# Patient Record
Sex: Female | Born: 1985 | Race: Black or African American | Hispanic: No | Marital: Single | Smoking: Former smoker
Health system: Southern US, Community
[De-identification: ages and names within clinical notes are randomized; demographics above are authoritative.]

## PROBLEM LIST (undated history)

## (undated) ENCOUNTER — Inpatient Hospital Stay (HOSPITAL_COMMUNITY): Payer: Self-pay

## (undated) DIAGNOSIS — I10 Essential (primary) hypertension: Secondary | ICD-10-CM

## (undated) DIAGNOSIS — F419 Anxiety disorder, unspecified: Secondary | ICD-10-CM

## (undated) HISTORY — PX: OTHER SURGICAL HISTORY: SHX169

## (undated) HISTORY — DX: Anxiety disorder, unspecified: F41.9

## (undated) HISTORY — DX: Essential (primary) hypertension: I10

## (undated) HISTORY — PX: DILATION AND CURETTAGE OF UTERUS: SHX78

---

## 2005-08-12 ENCOUNTER — Emergency Department (HOSPITAL_COMMUNITY): Admission: EM | Admit: 2005-08-12 | Discharge: 2005-08-12 | Payer: Self-pay | Admitting: Family Medicine

## 2012-08-02 ENCOUNTER — Encounter (HOSPITAL_COMMUNITY): Payer: Self-pay | Admitting: *Deleted

## 2012-08-02 ENCOUNTER — Inpatient Hospital Stay (HOSPITAL_COMMUNITY)
Admission: AD | Admit: 2012-08-02 | Discharge: 2012-08-02 | Disposition: A | Discharge status: Home / Self Care | Payer: Self-pay | Source: Ambulatory Visit | Attending: Obstetrics & Gynecology | Admitting: Obstetrics & Gynecology

## 2012-08-02 DIAGNOSIS — R109 Unspecified abdominal pain: Secondary | ICD-10-CM | POA: Insufficient documentation

## 2012-08-02 DIAGNOSIS — K219 Gastro-esophageal reflux disease without esophagitis: Secondary | ICD-10-CM

## 2012-08-02 DIAGNOSIS — O99891 Other specified diseases and conditions complicating pregnancy: Secondary | ICD-10-CM | POA: Insufficient documentation

## 2012-08-02 LAB — URINALYSIS, ROUTINE W REFLEX MICROSCOPIC
Glucose, UA: NEGATIVE mg/dL
Ketones, ur: NEGATIVE mg/dL
Leukocytes, UA: NEGATIVE
Nitrite: NEGATIVE
Protein, ur: NEGATIVE mg/dL
Urobilinogen, UA: 0.2 mg/dL (ref 0.0–1.0)

## 2012-08-02 MED ORDER — GI COCKTAIL ~~LOC~~
30.0000 mL | Freq: Once | ORAL | Status: AC
  Administered 2012-08-02: 30 mL via ORAL
  Filled 2012-08-02: qty 30

## 2012-08-02 MED ORDER — RANITIDINE HCL 75 MG PO TABS: 75.0000 mg | ORAL_TABLET | Freq: Two times a day (BID) | ORAL | Status: DC

## 2012-08-02 NOTE — MAU Note (Signed)
Sharp abd pain, upper down to mid abdomen, felt like cramps, started @ 1100, eased up a little bit now.  Denies bleeding, had nausea - no vomitting.

## 2012-08-02 NOTE — MAU Provider Note (Signed)
History     CSN: [id number]  Arrival date and time: 08/02/12 1303   First Provider Initiated Contact with Patient 08/02/12 1501      Chief Complaint  Patient presents with  . Abdominal Pain   HPI Hailey Olson is a 26 y.o. G3P0020 at [redacted]w[redacted]d presenting with c/o upper quadrant abdominal pain that started around 10:00 am today. She was at work when the pain started. She ranked it as a 10/10, she came straight to the MAU. By the time she was put in a room the pain had improved to a 3/10. Denied N/V, constipation, excess gas, vaginal bleeding or discharge. No treatment has been attempted to relieve the pain. No previous history of abdominal pain with this pregnancy. No recent change in eating habits. She is concerned about miscarriage. She has had 2 miscarriages the most recent occuring in May. She has not established prenatal care yet and is asking for recommendations.    OB History    Grav Para Term Preterm Abortions TAB SAB Ect Mult Living   3 0 0 0 2 0 2 0 0 0       History reviewed. No pertinent past medical history.  Past Surgical History  Procedure Date  . Dilation and curettage of uterus     for 2nd trimester loss.    History reviewed. No pertinent family history.  History  Substance Use Topics  . Smoking status: Current Every Day Smoker -- 0.2 packs/day    Types: Cigarettes  . Smokeless tobacco: Never Used  . Alcohol Use: No    Allergies: No Known Allergies  Prescriptions prior to admission  Medication Sig Dispense Refill  . Prenatal Vit-Fe Fumarate-FA (PRENATAL MULTIVITAMIN) TABS Take 1 tablet by mouth daily.        Review of Systems  Constitutional: Negative for fever, chills and malaise/fatigue.  Gastrointestinal: Positive for abdominal pain. Negative for nausea, vomiting, diarrhea and constipation.  Genitourinary: Negative.    Physical Exam   Blood pressure 113/73, pulse 73, temperature 98 F (36.7 C), temperature source Oral, resp. rate 18, height 5\' 7"   (1.702 m), weight 93.532 kg (206 lb 3.2 oz), last menstrual period 05/04/2012.  Physical Exam  Constitutional: She is oriented to person, place, and time. She appears well-developed and well-nourished. No distress.  Cardiovascular: Normal rate, regular rhythm and normal heart sounds.   Respiratory: Effort normal and breath sounds normal.  GI: Soft. Bowel sounds are normal. She exhibits no mass. There is tenderness in the epigastric area. There is no rebound and no guarding.  Neurological: She is alert and oriented to person, place, and time.  Skin: Skin is warm and dry. She is not diaphoretic.  Psychiatric: She has a normal mood and affect. Her behavior is normal. Judgment and thought content normal.    MAU Course  Procedures  Results for orders placed during the hospital encounter of 08/02/12 (from the past 24 hour(s))  URINALYSIS, ROUTINE W REFLEX MICROSCOPIC     Status: Normal   Collection Time   08/02/12  1:35 PM      Component Value Range   Color, Urine YELLOW  YELLOW   APPearance CLEAR  CLEAR   Specific Gravity, Urine 1.025  1.005 - 1.030   pH 6.5  5.0 - 8.0   Glucose, UA NEGATIVE  NEGATIVE mg/dL   Hgb urine dipstick NEGATIVE  NEGATIVE   Bilirubin Urine NEGATIVE  NEGATIVE   Ketones, ur NEGATIVE  NEGATIVE mg/dL   Protein, ur NEGATIVE  NEGATIVE mg/dL  Urobilinogen, UA 0.2  0.0 - 1.0 mg/dL   Nitrite NEGATIVE  NEGATIVE   Leukocytes, UA NEGATIVE  NEGATIVE  POCT PREGNANCY, URINE     Status: Abnormal   Collection Time   08/02/12  2:53 PM      Component Value Range   Preg Test, Ur POSITIVE (*) NEGATIVE   Bedside U/S performed: IUP confirmed with +heartbeat   Assessment and Plan  A: 26 y.o. G3P0020 at [redacted]w[redacted]d      Abdominal pain, unchanged with GI cocktail     Possibly secondary to heartburn or gas  P: Prescription for zantac sent to pharmacy     Education about digestive health, encouraged to take probiotics and eat high-fiber diet     Information on local OBGYNs provided,  instructed pt to establish prenatal care  Corky Downs 08/02/2012, 3:27 PM

## 2012-08-02 NOTE — MAU Provider Note (Signed)
Patient seen and examined by me. Agree with PA-Student assessment and note.   Hailey Berkshire N. 4:41 PM 08/02/12

## 2012-08-09 ENCOUNTER — Other Ambulatory Visit: Payer: Self-pay

## 2013-06-07 ENCOUNTER — Encounter (HOSPITAL_COMMUNITY): Payer: Self-pay | Admitting: *Deleted

## 2014-06-26 ENCOUNTER — Encounter (HOSPITAL_COMMUNITY): Payer: Self-pay | Admitting: *Deleted

## 2019-04-11 DIAGNOSIS — Z30430 Encounter for insertion of intrauterine contraceptive device: Secondary | ICD-10-CM | POA: Diagnosis not present

## 2019-08-30 DIAGNOSIS — L219 Seborrheic dermatitis, unspecified: Secondary | ICD-10-CM | POA: Diagnosis not present

## 2019-12-21 DIAGNOSIS — E669 Obesity, unspecified: Secondary | ICD-10-CM | POA: Diagnosis not present

## 2019-12-21 DIAGNOSIS — R635 Abnormal weight gain: Secondary | ICD-10-CM | POA: Diagnosis not present

## 2019-12-21 DIAGNOSIS — E010 Iodine-deficiency related diffuse (endemic) goiter: Secondary | ICD-10-CM | POA: Diagnosis not present

## 2019-12-21 DIAGNOSIS — R0989 Other specified symptoms and signs involving the circulatory and respiratory systems: Secondary | ICD-10-CM | POA: Diagnosis not present

## 2019-12-21 DIAGNOSIS — Z6839 Body mass index (BMI) 39.0-39.9, adult: Secondary | ICD-10-CM | POA: Diagnosis not present

## 2019-12-21 DIAGNOSIS — R200 Anesthesia of skin: Secondary | ICD-10-CM | POA: Diagnosis not present

## 2020-02-16 DIAGNOSIS — E041 Nontoxic single thyroid nodule: Secondary | ICD-10-CM | POA: Diagnosis not present

## 2020-02-16 DIAGNOSIS — E010 Iodine-deficiency related diffuse (endemic) goiter: Secondary | ICD-10-CM | POA: Diagnosis not present

## 2020-12-14 ENCOUNTER — Encounter: Payer: Self-pay | Admitting: Family Medicine

## 2020-12-14 ENCOUNTER — Other Ambulatory Visit: Payer: Self-pay

## 2020-12-14 ENCOUNTER — Ambulatory Visit (INDEPENDENT_AMBULATORY_CARE_PROVIDER_SITE_OTHER): Payer: BC Managed Care – PPO | Admitting: Family Medicine

## 2020-12-14 VITALS — BP 120/90 | HR 75 | Temp 97.9°F | Ht 66.5 in | Wt 255.2 lb

## 2020-12-14 DIAGNOSIS — R198 Other specified symptoms and signs involving the digestive system and abdomen: Secondary | ICD-10-CM

## 2020-12-14 DIAGNOSIS — E041 Nontoxic single thyroid nodule: Secondary | ICD-10-CM

## 2020-12-14 DIAGNOSIS — R0989 Other specified symptoms and signs involving the circulatory and respiratory systems: Secondary | ICD-10-CM

## 2020-12-14 MED ORDER — OMEPRAZOLE 40 MG PO CPDR: 40.0000 mg | DELAYED_RELEASE_CAPSULE | Freq: Every day | ORAL | 2 refills | Status: DC

## 2020-12-14 NOTE — Progress Notes (Signed)
Hailey Olson is a 35 y.o. female  Chief Complaint  Patient presents with  . Establish Care    Np here to est care. Pt will sign a ROR. Pt c/o having trouble swallowing food, pills x 4 months. Pt would like for provider to look at boil/knot on chest.    HPI: Hailey Olson is a 35 y.o. female who endorses globus sensation that is intermittent x 4 mo. She feels is it present mainly a little while after she eats.  She denies trouble swallowing or choking episodes. No issues with breathing, speaking. No runny nose, PND, seasonal allergies. No cough. Rare heartburn with red sauce and sausage, otherwise no.   She had US thyroid done in 2020 - 1.8cm Rt thyroid nodule. 1 year f/u recommended.     History reviewed. No pertinent past medical history.  Past Surgical History:  Procedure Laterality Date  . DILATION AND CURETTAGE OF UTERUS     for 2nd trimester loss.  . parturition  2014 and 2015    Social History   Socioeconomic History  . Marital status: Single    Spouse name: Not on file  . Number of children: Not on file  . Years of education: Not on file  . Highest education level: Not on file  Occupational History  . Not on file  Tobacco Use  . Smoking status: Former Smoker    Packs/day: 0.25    Types: Cigarettes  . Smokeless tobacco: Never Used  Vaping Use  . Vaping Use: Every day  Substance and Sexual Activity  . Alcohol use: No  . Drug use: No  . Sexual activity: Yes    Comment: last intercourse yesterday  Other Topics Concern  . Not on file  Social History Narrative  . Not on file   Social Determinants of Health   Financial Resource Strain: Not on file  Food Insecurity: Not on file  Transportation Needs: Not on file  Physical Activity: Not on file  Stress: Not on file  Social Connections: Not on file  Intimate Partner Violence: Not on file    Family History  Problem Relation Age of Onset  . Hypertension Mother   . Diabetes Mother   . Hyperlipidemia  Father       There is no immunization history on file for this patient.  Outpatient Encounter Medications as of 12/14/2020  Medication Sig  . amLODipine (NORVASC) 5 MG tablet amlodipine 5 mg tablet  . Prenatal Vit-Fe Fumarate-FA (PRENATAL MULTIVITAMIN) TABS Take 1 tablet by mouth daily. (Patient not taking: Reported on 12/14/2020)  . propranolol (INDERAL) 20 MG tablet Take by mouth. (Patient not taking: Reported on 12/14/2020)  . [DISCONTINUED] ranitidine (ZANTAC 75) 75 MG tablet Take 1 tablet (75 mg total) by mouth 2 (two) times daily.   No facility-administered encounter medications on file as of 12/14/2020.     ROS: Pertinent positives and negatives noted in HPI. Remainder of ROS non-contributory    No Known Allergies  BP 120/90 (BP Location: Left Arm, Patient Position: Sitting, Cuff Size: Normal)   Pulse 75   Temp 97.9 F (36.6 C) (Oral)   Ht 5' 6.5" (1.689 m)   Wt 255 lb 3.2 oz (115.8 kg)   SpO2 98%   BMI 40.57 kg/m   Physical Exam Constitutional:      General: She is not in acute distress.    Appearance: Normal appearance. She is not ill-appearing.  Neck:     Thyroid: Thyromegaly (mild) present. No thyroid  tenderness.  Pulmonary:     Effort: No respiratory distress.  Abdominal:     General: Bowel sounds are normal. There is no distension.     Palpations: Abdomen is soft.     Tenderness: There is no abdominal tenderness.  Musculoskeletal:     Cervical back: Normal range of motion and neck supple. No tenderness.  Lymphadenopathy:     Cervical: No cervical adenopathy.  Neurological:     Mental Status: She is alert and oriented to person, place, and time.  Psychiatric:        Mood and Affect: Mood normal.        Behavior: Behavior normal.      A/P:  1. Globus sensation - DG UGI W DOUBLE CM (HD BA); Future Rx: - omeprazole (PRILOSEC) 40 MG capsule; Take 1 capsule (40 mg total) by mouth daily.  Dispense: 30 capsule; Refill: 2 - recommend 3-4 wk trial of  med  2. Thyroid nodule - overdue for [place] f/u - US THYROID; Future

## 2021-01-08 ENCOUNTER — Other Ambulatory Visit: Payer: BC Managed Care – PPO

## 2021-01-14 ENCOUNTER — Other Ambulatory Visit: Payer: Self-pay

## 2021-01-28 ENCOUNTER — Other Ambulatory Visit: Payer: BC Managed Care – PPO

## 2021-02-01 ENCOUNTER — Ambulatory Visit
Admission: RE | Admit: 2021-02-01 | Discharge: 2021-02-01 | Disposition: A | Discharge status: Home / Self Care | Payer: BC Managed Care – PPO | Source: Ambulatory Visit | Attending: Family Medicine | Admitting: Family Medicine

## 2021-02-01 DIAGNOSIS — K219 Gastro-esophageal reflux disease without esophagitis: Secondary | ICD-10-CM | POA: Diagnosis not present

## 2021-02-01 DIAGNOSIS — E041 Nontoxic single thyroid nodule: Secondary | ICD-10-CM | POA: Diagnosis not present

## 2021-02-01 DIAGNOSIS — R0989 Other specified symptoms and signs involving the circulatory and respiratory systems: Secondary | ICD-10-CM

## 2021-03-06 ENCOUNTER — Ambulatory Visit (INDEPENDENT_AMBULATORY_CARE_PROVIDER_SITE_OTHER): Payer: BC Managed Care – PPO | Admitting: Family Medicine

## 2021-03-06 ENCOUNTER — Other Ambulatory Visit: Payer: Self-pay

## 2021-03-06 ENCOUNTER — Encounter: Payer: Self-pay | Admitting: Family Medicine

## 2021-03-06 VITALS — BP 142/100 | HR 73 | Temp 97.7°F | Ht 66.25 in | Wt 247.4 lb

## 2021-03-06 DIAGNOSIS — Z0000 Encounter for general adult medical examination without abnormal findings: Secondary | ICD-10-CM

## 2021-03-06 DIAGNOSIS — I10 Essential (primary) hypertension: Secondary | ICD-10-CM

## 2021-03-06 DIAGNOSIS — Z13220 Encounter for screening for lipoid disorders: Secondary | ICD-10-CM

## 2021-03-06 DIAGNOSIS — Z23 Encounter for immunization: Secondary | ICD-10-CM

## 2021-03-06 DIAGNOSIS — H6123 Impacted cerumen, bilateral: Secondary | ICD-10-CM

## 2021-03-06 MED ORDER — AMLODIPINE BESYLATE 5 MG PO TABS: 5.0000 mg | ORAL_TABLET | Freq: Every day | ORAL | 1 refills | Status: DC

## 2021-03-06 NOTE — Patient Instructions (Signed)
[facility]  [email or web address] [address], [address] [place] [address] P: [phone] F: [phone] [email or web address]  [facility] [email or web address] [address] , [place] [address] fax: [phone] [email or web address]  

## 2021-03-06 NOTE — Progress Notes (Signed)
Hailey Olson is a 35 y.o. female  Chief Complaint  Patient presents with   Annual Exam    Pt not fasting today. Pt unsure of last pap and Tdap    HPI: Hailey Olson is a 35 y.o. female patient seen today for annual CPE, labs. She is not fasting today and will RTO for fasting lab appt.   Last PAP: 10/2016 - normal PAP, no h/o abnormal PAP  Diet/Exercise: pt lost 8lbs since last OV but states she hasn't changed her diet or activity level Dental: due Vision: due   Med refills needed today? Yes, per orders   History reviewed. No pertinent past medical history.  Past Surgical History:  Procedure Laterality Date   DILATION AND CURETTAGE OF UTERUS     for 2nd trimester loss.   parturition  2014 and 2015    Social History   Socioeconomic History   Marital status: Single    Spouse name: Not on file   Number of children: Not on file   Years of education: Not on file   Highest education level: Not on file  Occupational History   Not on file  Tobacco Use   Smoking status: Former    Packs/day: 0.25    Pack years: 0.00    Types: Cigarettes   Smokeless tobacco: Never  Vaping Use   Vaping Use: Every day  Substance and Sexual Activity   Alcohol use: No   Drug use: No   Sexual activity: Yes    Comment: last intercourse yesterday  Other Topics Concern   Not on file  Social History Narrative   Not on file   Social Determinants of Health   Financial Resource Strain: Not on file  Food Insecurity: Not on file  Transportation Needs: Not on file  Physical Activity: Not on file  Stress: Not on file  Social Connections: Not on file  Intimate Partner Violence: Not on file    Family History  Problem Relation Age of Onset   Hypertension Mother    Diabetes Mother    Hyperlipidemia Father      There is no immunization history for the selected administration types on file for this patient.  Outpatient Encounter Medications as of 03/06/2021  Medication Sig   amLODipine  (NORVASC) 5 MG tablet amlodipine 5 mg tablet   [DISCONTINUED] omeprazole (PRILOSEC) 40 MG capsule Take 1 capsule (40 mg total) by mouth daily. (Patient not taking: Reported on 03/06/2021)   [DISCONTINUED] Prenatal Vit-Fe Fumarate-FA (PRENATAL MULTIVITAMIN) TABS Take 1 tablet by mouth daily. (Patient not taking: Reported on 12/14/2020)   [DISCONTINUED] propranolol (INDERAL) 20 MG tablet Take by mouth. (Patient not taking: Reported on 12/14/2020)   No facility-administered encounter medications on file as of 03/06/2021.     ROS: Gen: no fever, chills  Skin: no rash, itching ENT: no ear pain, ear drainage, nasal congestion, rhinorrhea, sinus pressure, sore throat Eyes: no blurry vision, double vision Resp: no cough, wheeze,SOB Breast: no breast tenderness, no nipple discharge, no breast masses CV: no CP, palpitations, LE edema,  GI: no heartburn, n/v/d/c, abd pain GU: no dysuria, urgency, frequency, hematuria Neuro: no dizziness, headache, weakness, vertigo Psych: no depression, anxiety, insomnia   No Known Allergies  BP (!) 142/100 (BP Location: Right Arm, Patient Position: Sitting, Cuff Size: Normal)   Pulse 73   Temp 97.7 F (36.5 C) (Temporal)   Ht 5' 6.25" (1.683 m)   Wt 247 lb 6.4 oz (112.2 kg)   SpO2 99%  BMI 39.63 kg/m  Wt Readings from Last 3 Encounters:  03/06/21 247 lb 6.4 oz (112.2 kg)  12/14/20 255 lb 3.2 oz (115.8 kg)  08/02/12 206 lb 3.2 oz (93.5 kg)   Temp Readings from Last 3 Encounters:  03/06/21 97.7 F (36.5 C) (Temporal)  12/14/20 97.9 F (36.6 C) (Oral)  08/02/12 98 F (36.7 C) (Oral)   BP Readings from Last 3 Encounters:  03/06/21 (!) 142/100  12/14/20 120/90  08/02/12 111/65   Pulse Readings from Last 3 Encounters:  03/06/21 73  12/14/20 75  08/02/12 61     Physical Exam Constitutional:      General: She is not in acute distress.    Appearance: She is well-developed. She is obese.  HENT:     Head: Normocephalic and atraumatic.      Right Ear: Ear canal normal. There is impacted cerumen.     Left Ear: Ear canal normal. There is impacted cerumen.     Nose: Nose normal.     Mouth/Throat:     Mouth: Mucous membranes are moist.     Pharynx: Oropharynx is clear.  Eyes:     Conjunctiva/sclera: Conjunctivae normal.  Neck:     Thyroid: No thyromegaly.  Cardiovascular:     Rate and Rhythm: Normal rate and regular rhythm.     Heart sounds: Normal heart sounds. No murmur heard. Pulmonary:     Effort: Pulmonary effort is normal. No respiratory distress.     Breath sounds: Normal breath sounds. No wheezing or rhonchi.  Abdominal:     General: Bowel sounds are normal. There is no distension.     Palpations: Abdomen is soft. There is no mass.     Tenderness: There is no abdominal tenderness.  Musculoskeletal:     Cervical back: Neck supple.     Right lower leg: No edema.     Left lower leg: No edema.  Lymphadenopathy:     Cervical: No cervical adenopathy.  Skin:    General: Skin is warm and dry.  Neurological:     Mental Status: She is alert and oriented to person, place, and time.     Motor: No abnormal muscle tone.     Coordination: Coordination normal.  Psychiatric:        Behavior: Behavior normal.     A/P:  1. Annual physical exam - discussed importance of regular CV exercise, healthy diet, adequate sleep - due for dental and vision - due for PAP - will schedule with GYN - CBC; Future - Basic metabolic panel; Future - AST; Future - ALT; Future - next CPE in 1 year  2. Benign essential HTN - elevated today but is caring for her 4yo niece and is trying to pack for vacation this Friday - Basic metabolic panel; Future - amLODipine (NORVASC) 5 MG tablet; Take 1 tablet (5 mg total) by mouth daily.  Dispense: 90 tablet; Refill: 1  3. Screening for lipid disorders - Lipid panel; Future  4. Need for Tdap vaccination - Tdap vaccine greater than or equal to 7yo IM  5. Bilateral impacted cerumen - informed  consent obtained, ears flushed, pt tolerated without issue - Ear Lavage   This visit occurred during the SARS-CoV-2 public health emergency.  Safety protocols were in place, including screening questions prior to the visit, additional usage of staff PPE, and extensive cleaning of exam room while observing appropriate contact time as indicated for disinfecting solutions.

## 2021-03-13 ENCOUNTER — Other Ambulatory Visit: Payer: BC Managed Care – PPO

## 2021-03-13 ENCOUNTER — Other Ambulatory Visit: Payer: Self-pay | Admitting: Family Medicine

## 2021-03-13 DIAGNOSIS — R198 Other specified symptoms and signs involving the digestive system and abdomen: Secondary | ICD-10-CM

## 2021-03-13 DIAGNOSIS — R0989 Other specified symptoms and signs involving the circulatory and respiratory systems: Secondary | ICD-10-CM

## 2021-07-23 ENCOUNTER — Ambulatory Visit: Payer: BC Managed Care – PPO | Admitting: Nurse Practitioner

## 2021-10-10 ENCOUNTER — Other Ambulatory Visit: Payer: Self-pay

## 2021-10-11 ENCOUNTER — Ambulatory Visit (INDEPENDENT_AMBULATORY_CARE_PROVIDER_SITE_OTHER): Payer: BC Managed Care – PPO | Admitting: Nurse Practitioner

## 2021-10-11 ENCOUNTER — Encounter: Payer: Self-pay | Admitting: Nurse Practitioner

## 2021-10-11 VITALS — BP 134/86 | HR 83 | Temp 96.9°F | Wt 247.4 lb

## 2021-10-11 DIAGNOSIS — I10 Essential (primary) hypertension: Secondary | ICD-10-CM | POA: Diagnosis not present

## 2021-10-11 DIAGNOSIS — R519 Headache, unspecified: Secondary | ICD-10-CM | POA: Diagnosis not present

## 2021-10-11 DIAGNOSIS — Z13220 Encounter for screening for lipoid disorders: Secondary | ICD-10-CM | POA: Diagnosis not present

## 2021-10-11 DIAGNOSIS — Z136 Encounter for screening for cardiovascular disorders: Secondary | ICD-10-CM

## 2021-10-11 DIAGNOSIS — F419 Anxiety disorder, unspecified: Secondary | ICD-10-CM

## 2021-10-11 NOTE — Assessment & Plan Note (Signed)
Chronic, stable. BP 134/86 today. Continue amlodipine 5mg  daily. Check CMP, CBC today. Follow up in 5 months for physical.

## 2021-10-11 NOTE — Patient Instructions (Signed)
It was great to see you!  We are going to check some labs today and will send the results to mychart.   Let's follow-up in 5 months, sooner if you have concerns.  If a referral was placed today, you will be contacted for an appointment. Please note that routine referrals can sometimes take up to 3-4 weeks to process. Please call our office if you haven't heard anything after this time frame.  Take care,  Rodman Pickle, NP

## 2021-10-11 NOTE — Assessment & Plan Note (Signed)
Chronic, stable. Continue propranolol as needed for anxiety. Follow up in 5 months for CPE.

## 2021-10-11 NOTE — Assessment & Plan Note (Signed)
She endorses headache and numb feeling for about a month. Symptoms are now resolved. Encouraged her to check her blood pressure at home if this happens again. Will check vitamin B12 and TSH with numbness feelings. Follow up if these symptoms occur again.

## 2021-10-11 NOTE — Progress Notes (Signed)
Established Patient Office Visit  Subjective:  Patient ID: Hailey Olson, female    DOB: 04-15-1986  Age: 36 y.o. MRN: [id number]  CC:  Chief Complaint  Patient presents with   Transitions Of Care    Est care. Pt c/o numbness feeling with headaches/sharp pains x1 month    HPI [name] presents to transfer care to a new provider. Introduced to Designer, jewellery role and practice setting. All questions answered. Discussed provider/patient relationship and expectations.  She has noticed numb feelings in random spots on her head, not numb to touch but just a feeling. Also had some spots with a sharp pain. This started about a month ago. She is unsure if this is part of her anxiety. These symptoms were occurring every day for the last month. BC goody poweder helped headaches but not "numb" feeling. She denies vision changes, confusion, forgetfulness, weakness, slurred speech, and double vision. She endorses sometimes she would feel off-balance. She has had headaches with elevated blood pressure in past. Not checking BP at home. These headaches and numbness feelings have resolved on Monday.   Depression and anxiety screening done and shows:  Depression screen [facility] 2/9 10/11/2021  Decreased Interest 0  Down, Depressed, Hopeless 0  PHQ - 2 Score 0  Altered sleeping 0  Tired, decreased energy 0  Change in appetite 0  Feeling bad or failure about yourself  0  Trouble concentrating 0  Moving slowly or fidgety/restless 0  Suicidal thoughts 0  PHQ-9 Score 0  Difficult doing work/chores Not difficult at all   GAD 7 : Generalized Anxiety Score 10/11/2021  Nervous, Anxious, on Edge 1  Control/stop worrying 0  Worry too much - different things 2  Trouble relaxing 0  Restless 0  Easily annoyed or irritable 1  Afraid - awful might happen 2  Total GAD 7 Score 6  Anxiety Difficulty Not difficult at all    Past Medical History:  Diagnosis Date   Anxiety    Hypertension     Past Surgical  History:  Procedure Laterality Date   DILATION AND CURETTAGE OF UTERUS     for 2nd trimester loss.   parturition  2014 and 2015    Family History  Problem Relation Age of Onset   Hypertension Mother    Diabetes Mother    Hyperlipidemia Father     Social History   Socioeconomic History   Marital status: Single    Spouse name: Not on file   Number of children: Not on file   Years of education: Not on file   Highest education level: Not on file  Occupational History   Not on file  Tobacco Use   Smoking status: Former    Packs/day: 0.25    Types: Cigarettes   Smokeless tobacco: Never  Vaping Use   Vaping Use: Every day   Substances: Nicotine  Substance and Sexual Activity   Alcohol use: No   Drug use: No   Sexual activity: Yes    Comment: last intercourse yesterday  Other Topics Concern   Not on file  Social History Narrative   Not on file   Social Determinants of Health   Financial Resource Strain: Not on file  Food Insecurity: Not on file  Transportation Needs: Not on file  Physical Activity: Not on file  Stress: Not on file  Social Connections: Not on file  Intimate Partner Violence: Not on file    Outpatient Medications Prior to Visit  Medication Sig Dispense  Refill   propranolol (INDERAL) 20 MG tablet Take 20 mg by mouth 3 (three) times daily as needed.     amLODipine (NORVASC) 5 MG tablet Take 1 tablet (5 mg total) by mouth daily. 90 tablet 1   No facility-administered medications prior to visit.    No Known Allergies  ROS Review of Systems  Constitutional: Negative.   HENT:  Positive for ear pain (intermittent). Negative for congestion and sore throat.   Eyes: Negative.   Respiratory: Negative.    Cardiovascular: Negative.   Gastrointestinal: Negative.   Genitourinary: Negative.   Musculoskeletal: Negative.   Skin: Negative.   Neurological:  Positive for headaches (see hpi).  Psychiatric/Behavioral:  The patient is nervous/anxious.       Objective:    Physical Exam Vitals and nursing note reviewed.  Constitutional:      General: She is not in acute distress.    Appearance: Normal appearance.  HENT:     Head: Normocephalic and atraumatic.     Right Ear: Tympanic membrane, ear canal and external ear normal.     Left Ear: Tympanic membrane, ear canal and external ear normal.     Nose: Nose normal.     Mouth/Throat:     Mouth: Mucous membranes are moist.     Pharynx: Oropharynx is clear.  Eyes:     Extraocular Movements: Extraocular movements intact.     Conjunctiva/sclera: Conjunctivae normal.     Pupils: Pupils are equal, round, and reactive to light.  Cardiovascular:     Rate and Rhythm: Normal rate and regular rhythm.     Pulses: Normal pulses.     Heart sounds: Normal heart sounds.  Pulmonary:     Effort: Pulmonary effort is normal.     Breath sounds: Normal breath sounds.  Abdominal:     General: Bowel sounds are normal.     Palpations: Abdomen is soft.     Tenderness: There is no abdominal tenderness.  Musculoskeletal:        General: Normal range of motion.     Cervical back: Normal range of motion and neck supple. No tenderness.  Lymphadenopathy:     Cervical: No cervical adenopathy.  Skin:    General: Skin is warm and dry.  Neurological:     General: No focal deficit present.     Mental Status: She is alert and oriented to person, place, and time.     Cranial Nerves: No cranial nerve deficit.     Motor: No weakness.     Coordination: Coordination normal.     Gait: Gait normal.     Deep Tendon Reflexes: Reflexes normal.  Psychiatric:        Mood and Affect: Mood normal.        Behavior: Behavior normal.        Thought Content: Thought content normal.        Judgment: Judgment normal.    BP 134/86 (BP Location: Right Arm, Cuff Size: Large)    Pulse 83    Temp (!) 96.9 F (36.1 C) (Temporal)    Wt 247 lb 6.4 oz (112.2 kg)    SpO2 99%    BMI 39.63 kg/m  Wt Readings from Last 3 Encounters:   10/11/21 247 lb 6.4 oz (112.2 kg)  03/06/21 247 lb 6.4 oz (112.2 kg)  12/14/20 255 lb 3.2 oz (115.8 kg)     Health Maintenance Due  Topic Date Due   Hepatitis C Screening  Never done  PAP SMEAR-Modifier  10/28/2019    There are no preventive care reminders to display for this patient.  No results found for: TSH No results found for: WBC, HGB, HCT, MCV, PLT No results found for: NA, K, CHLORIDE, CO2, GLUCOSE, BUN, CREATININE, BILITOT, ALKPHOS, AST, ALT, PROT, ALBUMIN, CALCIUM, ANIONGAP, EGFR, GFR No results found for: CHOL No results found for: HDL No results found for: LDLCALC No results found for: TRIG No results found for: CHOLHDL No results found for: HGBA1C    Assessment & Plan:   Problem List Items Addressed This Visit       Cardiovascular and Mediastinum   Primary hypertension    Chronic, stable. BP 134/86 today. Continue amlodipine 65m daily. Check CMP, CBC today. Follow up in 5 months for physical.       Relevant Medications   propranolol (INDERAL) 20 MG tablet   Other Relevant Orders   CBC with Differential/Platelet   Comprehensive metabolic panel     Other   Nonintractable episodic headache - Primary    She endorses headache and numb feeling for about a month. Symptoms are now resolved. Encouraged her to check her blood pressure at home if this happens again. Will check vitamin B12 and TSH with numbness feelings. Follow up if these symptoms occur again.       Relevant Medications   propranolol (INDERAL) 20 MG tablet   Other Relevant Orders   Vitamin B12   Anxiety    Chronic, stable. Continue propranolol as needed for anxiety. Follow up in 5 months for CPE.       Relevant Orders   TSH   Other Visit Diagnoses     Encounter for lipid screening for cardiovascular disease       Check lipid panel today and treat based on results.   Relevant Orders   Lipid panel       No orders of the defined types were placed in this encounter.   Follow-up:  Return in about 5 months (around 03/10/2022) for CPE after 03/07/22.    LCharyl Dancer NP

## 2021-10-12 LAB — COMPREHENSIVE METABOLIC PANEL
AG Ratio: 1.3 (calc) (ref 1.0–2.5)
ALT: 9 U/L (ref 6–29)
AST: 8 U/L — ABNORMAL LOW (ref 10–30)
Albumin: 4 g/dL (ref 3.6–5.1)
Alkaline phosphatase (APISO): 48 U/L (ref 31–125)
BUN: 12 mg/dL (ref 7–25)
CO2: 24 mmol/L (ref 20–32)
Calcium: 9.7 mg/dL (ref 8.6–10.2)
Chloride: 107 mmol/L (ref 98–110)
Creat: 0.94 mg/dL (ref 0.50–0.97)
Globulin: 3 g/dL (calc) (ref 1.9–3.7)
Glucose, Bld: 86 mg/dL (ref 65–99)
Potassium: 3.9 mmol/L (ref 3.5–5.3)
Sodium: 140 mmol/L (ref 135–146)
Total Bilirubin: 0.2 mg/dL (ref 0.2–1.2)
Total Protein: 7 g/dL (ref 6.1–8.1)

## 2021-10-12 LAB — CBC WITH DIFFERENTIAL/PLATELET
Absolute Monocytes: 510 cells/uL (ref 200–950)
Basophils Absolute: 18 cells/uL (ref 0–200)
Basophils Relative: 0.3 %
Eosinophils Absolute: 78 cells/uL (ref 15–500)
Eosinophils Relative: 1.3 %
HCT: 35 % (ref 35.0–45.0)
Hemoglobin: 11.7 g/dL (ref 11.7–15.5)
Lymphs Abs: 1944 cells/uL (ref 850–3900)
MCH: 28.2 pg (ref 27.0–33.0)
MCHC: 33.4 g/dL (ref 32.0–36.0)
MCV: 84.3 fL (ref 80.0–100.0)
MPV: 10.2 fL (ref 7.5–12.5)
Monocytes Relative: 8.5 %
Neutro Abs: 3450 cells/uL (ref 1500–7800)
Neutrophils Relative %: 57.5 %
Platelets: 341 10*3/uL (ref 140–400)
RBC: 4.15 10*6/uL (ref 3.80–5.10)
RDW: 13.6 % (ref 11.0–15.0)
Total Lymphocyte: 32.4 %
WBC: 6 10*3/uL (ref 3.8–10.8)

## 2021-10-12 LAB — VITAMIN B12: Vitamin B-12: 500 pg/mL (ref 200–1100)

## 2021-10-12 LAB — LIPID PANEL
Cholesterol: 217 mg/dL — ABNORMAL HIGH (ref <200)
HDL: 40 mg/dL — ABNORMAL LOW (ref >=50)
LDL Cholesterol (Calc): 146 mg/dL (calc) — ABNORMAL HIGH
Non-HDL Cholesterol (Calc): 177 mg/dL (calc) — ABNORMAL HIGH (ref <130)
Total CHOL/HDL Ratio: 5.4 (calc) — ABNORMAL HIGH (ref <5.0)
Triglycerides: 176 mg/dL — ABNORMAL HIGH (ref <150)

## 2021-10-12 LAB — TSH: TSH: 0.98 mIU/L

## 2021-11-28 ENCOUNTER — Other Ambulatory Visit: Payer: Self-pay

## 2021-11-28 DIAGNOSIS — I10 Essential (primary) hypertension: Secondary | ICD-10-CM

## 2021-11-28 MED ORDER — AMLODIPINE BESYLATE 5 MG PO TABS: 5.0000 mg | ORAL_TABLET | Freq: Every day | ORAL | 1 refills | Status: DC

## 2022-03-10 ENCOUNTER — Other Ambulatory Visit: Payer: Self-pay | Admitting: Nurse Practitioner

## 2022-03-10 ENCOUNTER — Ambulatory Visit (INDEPENDENT_AMBULATORY_CARE_PROVIDER_SITE_OTHER): Payer: BC Managed Care – PPO | Admitting: Nurse Practitioner

## 2022-03-10 ENCOUNTER — Other Ambulatory Visit (HOSPITAL_COMMUNITY)
Admission: RE | Admit: 2022-03-10 | Discharge: 2022-03-10 | Disposition: A | Discharge status: Home / Self Care | Payer: BC Managed Care – PPO | Source: Ambulatory Visit | Attending: Nurse Practitioner | Admitting: Nurse Practitioner

## 2022-03-10 ENCOUNTER — Encounter: Payer: Self-pay | Admitting: Nurse Practitioner

## 2022-03-10 ENCOUNTER — Ambulatory Visit (INDEPENDENT_AMBULATORY_CARE_PROVIDER_SITE_OTHER): Payer: BC Managed Care – PPO

## 2022-03-10 VITALS — BP 124/90 | HR 80 | Temp 97.5°F | Wt 247.2 lb

## 2022-03-10 DIAGNOSIS — Z8249 Family history of ischemic heart disease and other diseases of the circulatory system: Secondary | ICD-10-CM

## 2022-03-10 DIAGNOSIS — R0789 Other chest pain: Secondary | ICD-10-CM

## 2022-03-10 DIAGNOSIS — Z0001 Encounter for general adult medical examination with abnormal findings: Secondary | ICD-10-CM

## 2022-03-10 DIAGNOSIS — F419 Anxiety disorder, unspecified: Secondary | ICD-10-CM | POA: Diagnosis not present

## 2022-03-10 DIAGNOSIS — M79671 Pain in right foot: Secondary | ICD-10-CM

## 2022-03-10 DIAGNOSIS — Z124 Encounter for screening for malignant neoplasm of cervix: Secondary | ICD-10-CM | POA: Diagnosis not present

## 2022-03-10 DIAGNOSIS — R6889 Other general symptoms and signs: Secondary | ICD-10-CM

## 2022-03-10 DIAGNOSIS — E782 Mixed hyperlipidemia: Secondary | ICD-10-CM | POA: Diagnosis not present

## 2022-03-10 DIAGNOSIS — I10 Essential (primary) hypertension: Secondary | ICD-10-CM

## 2022-03-10 DIAGNOSIS — E041 Nontoxic single thyroid nodule: Secondary | ICD-10-CM

## 2022-03-10 MED ORDER — PROPRANOLOL HCL 20 MG PO TABS: 20.0000 mg | ORAL_TABLET | Freq: Three times a day (TID) | ORAL | 1 refills | Status: DC | PRN

## 2022-03-10 NOTE — Progress Notes (Signed)
EKG interpreted by me on 03/10/22 showed sinus bradycardia with a heart rate of 59. No ST or T wave changes.

## 2022-03-10 NOTE — Patient Instructions (Signed)
It was great to see you!  We are checking an x-ray of your foot and labs  I have placed a referral to cardiology, they will call to schedule.  I am ordering a ultrasound of your thyroid, they will call to schedule.   Let's follow-up in 6 months, sooner if you have concerns.  If a referral was placed today, you will be contacted for an appointment. Please note that routine referrals can sometimes take up to 3-4 weeks to process. Please call our office if you haven't heard anything after this time frame.  Take care,  Rodman Pickle, NP

## 2022-03-10 NOTE — Progress Notes (Unsigned)
Initial visit for RT heel pain. Denies injury

## 2022-03-10 NOTE — Progress Notes (Unsigned)
BP 124/90 (BP Location: Right Arm, Cuff Size: Large)   Pulse 80   Temp (!) 97.5 F (36.4 C) (Temporal)   Wt 247 lb 3.2 oz (112.1 kg)   SpO2 99%   BMI 39.60 kg/m    Subjective:    Patient ID: Hailey Olson, female    DOB: 10/20/1985, 36 y.o.   MRN: [id number]  CC: Chief Complaint  Patient presents with   Follow-up    CPE w/pap. Pt is fasting   HPI: Hailey Olson is a 36 y.o. female presenting on 03/10/2022 for comprehensive medical examination. Current medical complaints include: anxiety  She has been having increased anxiety recently. She had 2 family members pass away recently from heart attacks. Her cousin was 1841 and had a massive heart attack and passed away. She has been taking propanolol 20mg  as needed for anxiety. She noticed a "strain" sensation on the left side of her chest that would come and go for a week. She is not sure if this is due to anxiety.   She has been having right heel pain after standing on her feet for long periods of time. She describes it as someone is stabbing her with a pin. The pain comes and goes. She has had this pain for a while but recently it has been occurring more frequently. She will sit down for a while which helps with the pain.   She currently lives with: children Menopausal Symptoms: no  Depression Screen done today and results listed below:     03/10/2022    1:03 PM 10/11/2021    2:49 PM  Depression screen PHQ 2/9  Decreased Interest 0 0  Down, Depressed, Hopeless 0 0  PHQ - 2 Score 0 0  Altered sleeping 0 0  Tired, decreased energy 0 0  Change in appetite 0 0  Feeling bad or failure about yourself  0 0  Trouble concentrating 0 0  Moving slowly or fidgety/restless 0 0  Suicidal thoughts 0 0  PHQ-9 Score 0 0  Difficult doing work/chores  Not difficult at all      03/10/2022    1:03 PM 10/11/2021    2:49 PM  GAD 7 : Generalized Anxiety Score  Nervous, Anxious, on Edge 2 1  Control/stop worrying 1 0  Worry too much - different  things 0 2  Trouble relaxing 0 0  Restless 0 0  Easily annoyed or irritable 0 1  Afraid - awful might happen 2 2  Total GAD 7 Score 5 6  Anxiety Difficulty  Not difficult at all    The patient does not have a history of falls. I did not complete a risk assessment for falls. A plan of care for falls was not documented.   Past Medical History:  Past Medical History:  Diagnosis Date   Anxiety    Hypertension     Surgical History:  Past Surgical History:  Procedure Laterality Date   DILATION AND CURETTAGE OF UTERUS     for 2nd trimester loss.   parturition  2014 and 2015    Medications:  Current Outpatient Medications on File Prior to Visit  Medication Sig   amLODipine (NORVASC) 5 MG tablet Take 1 tablet (5 mg total) by mouth daily.   No current facility-administered medications on file prior to visit.    Allergies:  No Known Allergies  Social History:  Social History   Socioeconomic History   Marital status: Single    Spouse name: Not on file  Number of children: Not on file   Years of education: Not on file   Highest education level: Not on file  Occupational History   Not on file  Tobacco Use   Smoking status: Former    Packs/day: 0.25    Types: Cigarettes   Smokeless tobacco: Never  Vaping Use   Vaping Use: Every day   Substances: Nicotine  Substance and Sexual Activity   Alcohol use: No   Drug use: No   Sexual activity: Yes    Comment: last intercourse yesterday  Other Topics Concern   Not on file  Social History Narrative   Not on file   Social Determinants of Health   Financial Resource Strain: Not on file  Food Insecurity: Not on file  Transportation Needs: Not on file  Physical Activity: Not on file  Stress: Not on file  Social Connections: Not on file  Intimate Partner Violence: Not on file   Social History   Tobacco Use  Smoking Status Former   Packs/day: 0.25   Types: Cigarettes  Smokeless Tobacco Never   Social History    Substance and Sexual Activity  Alcohol Use No    Family History:  Family History  Problem Relation Age of Onset   Hypertension Mother    Diabetes Mother    Hyperlipidemia Father    Heart attack Paternal Grandmother    Stroke Paternal Grandmother    Heart attack Other 62    Past medical history, surgical history, medications, allergies, family history and social history reviewed with patient today and changes made to appropriate areas of the chart.   Review of Systems  Constitutional: Negative.   HENT: Negative.    Eyes: Negative.   Respiratory: Negative.    Cardiovascular:        Left side "strain" intermittent  Gastrointestinal:  Positive for constipation. Negative for abdominal pain.  Genitourinary: Negative.   Musculoskeletal:  Positive for joint pain.       Right heel   Skin: Negative.   Neurological:  Positive for headaches (intermittent).  Psychiatric/Behavioral:  The patient is nervous/anxious.       Objective:    BP 124/90 (BP Location: Right Arm, Cuff Size: Large)   Pulse 80   Temp (!) 97.5 F (36.4 C) (Temporal)   Wt 247 lb 3.2 oz (112.1 kg)   SpO2 99%   BMI 39.60 kg/m   Wt Readings from Last 3 Encounters:  03/10/22 247 lb 3.2 oz (112.1 kg)  10/11/21 247 lb 6.4 oz (112.2 kg)  03/06/21 247 lb 6.4 oz (112.2 kg)    Physical Exam Vitals and nursing note reviewed.  Constitutional:      General: She is not in acute distress.    Appearance: Normal appearance.  HENT:     Head: Normocephalic and atraumatic.     Right Ear: Tympanic membrane, ear canal and external ear normal.     Left Ear: Tympanic membrane, ear canal and external ear normal.  Eyes:     Conjunctiva/sclera: Conjunctivae normal.  Cardiovascular:     Rate and Rhythm: Normal rate and regular rhythm.     Pulses: Normal pulses.     Heart sounds: Normal heart sounds.  Pulmonary:     Effort: Pulmonary effort is normal.     Breath sounds: Normal breath sounds.  Abdominal:     Palpations:  Abdomen is soft.     Tenderness: There is no abdominal tenderness.  Musculoskeletal:        General:  No tenderness. Normal range of motion.     Cervical back: Normal range of motion and neck supple. No tenderness.     Right lower leg: No edema.     Left lower leg: No edema.  Lymphadenopathy:     Cervical: No cervical adenopathy.  Skin:    General: Skin is warm and dry.  Neurological:     General: No focal deficit present.     Mental Status: She is alert and oriented to person, place, and time.     Cranial Nerves: No cranial nerve deficit.     Coordination: Coordination normal.     Gait: Gait normal.  Psychiatric:        Mood and Affect: Mood normal.        Behavior: Behavior normal.        Thought Content: Thought content normal.        Judgment: Judgment normal.     Results for orders placed or performed in visit on 10/11/21  Lipid panel  Result Value Ref Range   Cholesterol 217 (H) <200 mg/dL   HDL 40 (L) > OR = 50 mg/dL   Triglycerides 720 (H) <150 mg/dL   LDL Cholesterol (Calc) 146 (H) mg/dL (calc)   Total CHOL/HDL Ratio 5.4 (H) <5.0 (calc)   Non-HDL Cholesterol (Calc) 177 (H) <130 mg/dL (calc)  CBC with Differential/Platelet  Result Value Ref Range   WBC 6.0 3.8 - 10.8 Thousand/uL   RBC 4.15 3.80 - 5.10 Million/uL   Hemoglobin 11.7 11.7 - 15.5 g/dL   HCT 94.7 09.6 - 28.3 %   MCV 84.3 80.0 - 100.0 fL   MCH 28.2 27.0 - 33.0 pg   MCHC 33.4 32.0 - 36.0 g/dL   RDW 66.2 94.7 - 65.4 %   Platelets 341 140 - 400 Thousand/uL   MPV 10.2 7.5 - 12.5 fL   Neutro Abs 3,450 1,500 - 7,800 cells/uL   Lymphs Abs 1,944 850 - 3,900 cells/uL   Absolute Monocytes 510 200 - 950 cells/uL   Eosinophils Absolute 78 15 - 500 cells/uL   Basophils Absolute 18 0 - 200 cells/uL   Neutrophils Relative % 57.5 %   Total Lymphocyte 32.4 %   Monocytes Relative 8.5 %   Eosinophils Relative 1.3 %   Basophils Relative 0.3 %  Comprehensive metabolic panel  Result Value Ref Range   Glucose, Bld  86 65 - 99 mg/dL   BUN 12 7 - 25 mg/dL   Creat 6.50 3.54 - 6.56 mg/dL   BUN/Creatinine Ratio NOT APPLICABLE 6 - 22 (calc)   Sodium 140 135 - 146 mmol/L   Potassium 3.9 3.5 - 5.3 mmol/L   Chloride 107 98 - 110 mmol/L   CO2 24 20 - 32 mmol/L   Calcium 9.7 8.6 - 10.2 mg/dL   Total Protein 7.0 6.1 - 8.1 g/dL   Albumin 4.0 3.6 - 5.1 g/dL   Globulin 3.0 1.9 - 3.7 g/dL (calc)   AG Ratio 1.3 1.0 - 2.5 (calc)   Total Bilirubin 0.2 0.2 - 1.2 mg/dL   Alkaline phosphatase (APISO) 48 31 - 125 U/L   AST 8 (L) 10 - 30 U/L   ALT 9 6 - 29 U/L  TSH  Result Value Ref Range   TSH 0.98 mIU/L  Vitamin B12  Result Value Ref Range   Vitamin B-12 500 200 - 1,100 pg/mL      Assessment & Plan:   Problem List Items Addressed This Visit  Cardiovascular and Mediastinum   Primary hypertension    Chronic, stable.  She did not take her medicine this morning.  BP today 124/90.  Continue amlodipine 5 mg daily.  Follow-up in 6 months        Endocrine   Thyroid nodule    Thyroid nodule noted on imaging 2 years ago.  Recommend yearly imaging for 5 years to make sure that it is stable.  We will check TSH and thyroid ultrasound.      Relevant Orders   TSH   US THYROID     Other   Anxiety    Chronic, ongoing.  She recently had 2 deaths in her family which exacerbated her symptoms.  Her PHQ-9 is as 0 and her GAD-7 is a 5.  She is taking propanolol 20 mg as needed for anxiety which helps her symptoms.  She does not feel like she needs any other medication at this time.  Follow-up if symptoms worsen or with any concerns.      Left chest pressure    She has been having some left intermittent chest strain.  She is unsure if this is related to anxiety.  EKG done today which showed sinus bradycardia with a heart rate of 59, no ST or T wave changes.  We will place referral to cardiology with her family history of heart disease and her cousin recently passing at age 35 due to a massive heart attack.       Relevant Orders   EKG 12-Lead (Completed)   Ambulatory referral to Cardiology   Mixed hyperlipidemia    She has a history of elevated cholesterol, will recheck fasting lipid panel today      Relevant Orders   Lipid panel   Sensitivity to the cold    She has been feeling sensitivity to the cold.  She states that when the [facility] is on it 75 she stays cold in her house.  We will check an iron panel, CBC, and TSH today.      Relevant Orders   CBC   Iron, TIBC and Ferritin Panel   Pain of right heel    He has been having pain in her right heel for the past several years that is gotten worse recently.  The pain feels like somebody is stabbing her and happens when she stands on her feet too long.  We will check an x-ray to rule out heel spur.  She can take tylenol or ibuprofen as needed for pain. Follow-up after imaging.      Relevant Orders   DG Foot Complete Right   Family history of heart disease    She has a significant family of heart disease and her cousin passed away recently at age 24 due to a massive heart attack.  We will place referral to cardiology per patient request.      Relevant Orders   Ambulatory referral to Cardiology   Other Visit Diagnoses     Encounter for general adult medical examination with abnormal findings    -  Primary   Health maintenance reviewed and updated.  TD booster updated and Pap done today.  Discussed nutrition, exercise.  Follow-up 1 year   Screening for cervical cancer       Pap done today   Relevant Orders   Cytology - PAP        Follow up plan: Return in about 6 months (around 09/10/2022) for HTN, HLD.   LABORATORY TESTING:  - Pap  smear: done elsewhere  IMMUNIZATIONS:   - Tdap: Tetanus vaccination status reviewed: Td vaccination indicated and given today. - Influenza: Given elsewhere - Pneumovax: Given elsewhere - Prevnar: Given elsewhere - HPV: Given elsewhere - Zostavax vaccine: Given elsewhere  SCREENING: -Mammogram: Done  elsewhere  - Colonoscopy: Done elsewhere  - Bone Density: Done elsewhere  -Hearing Test: Done elsewhere  -Spirometry: Done elsewhere   PATIENT COUNSELING:   Advised to take 1 mg of folate supplement per day if capable of pregnancy.   Sexuality: Discussed sexually transmitted diseases, partner selection, use of condoms, avoidance of unintended pregnancy  and contraceptive alternatives.   Advised to avoid cigarette smoking.  I discussed with the patient that most people either abstain from alcohol or drink within safe limits (<=14/week and <=4 drinks/occasion for males, <=7/weeks and <= 3 drinks/occasion for females) and that the risk for alcohol disorders and other health effects rises proportionally with the number of drinks per week and how often a drinker exceeds daily limits.  Discussed cessation/primary prevention of drug use and availability of treatment for abuse.   Diet: Encouraged to adjust caloric intake to maintain  or achieve ideal body weight, to reduce intake of dietary saturated fat and total fat, to limit sodium intake by avoiding high sodium foods and not adding table salt, and to maintain adequate dietary potassium and calcium preferably from fresh fruits, vegetables, and low-fat dairy products.    stressed the importance of regular exercise  Injury prevention: Discussed safety belts, safety helmets, smoke detector, smoking near bedding or upholstery.   Dental health: Discussed importance of regular tooth brushing, flossing, and dental visits.    NEXT PREVENTATIVE PHYSICAL DUE IN 1 YEAR. Return in about 6 months (around 09/10/2022) for HTN, HLD.

## 2022-03-11 ENCOUNTER — Encounter: Payer: Self-pay | Admitting: Nurse Practitioner

## 2022-03-11 ENCOUNTER — Encounter: Payer: BC Managed Care – PPO | Admitting: Nurse Practitioner

## 2022-03-11 DIAGNOSIS — R6889 Other general symptoms and signs: Secondary | ICD-10-CM | POA: Insufficient documentation

## 2022-03-11 DIAGNOSIS — M79671 Pain in right foot: Secondary | ICD-10-CM

## 2022-03-11 DIAGNOSIS — E041 Nontoxic single thyroid nodule: Secondary | ICD-10-CM | POA: Insufficient documentation

## 2022-03-11 DIAGNOSIS — E782 Mixed hyperlipidemia: Secondary | ICD-10-CM | POA: Insufficient documentation

## 2022-03-11 DIAGNOSIS — R0789 Other chest pain: Secondary | ICD-10-CM | POA: Insufficient documentation

## 2022-03-11 DIAGNOSIS — Z8249 Family history of ischemic heart disease and other diseases of the circulatory system: Secondary | ICD-10-CM | POA: Insufficient documentation

## 2022-03-11 NOTE — Assessment & Plan Note (Signed)
Thyroid nodule noted on imaging 2 years ago.  Recommend yearly imaging for 5 years to make sure that it is stable.  We will check TSH and thyroid ultrasound.

## 2022-03-11 NOTE — Assessment & Plan Note (Signed)
She has been having some left intermittent chest strain.  She is unsure if this is related to anxiety.  EKG done today which showed sinus bradycardia with a heart rate of 59, no ST or T wave changes.  We will place referral to cardiology with her family history of heart disease and her cousin recently passing at age 38 due to a massive heart attack.

## 2022-03-11 NOTE — Assessment & Plan Note (Signed)
She has a significant family of heart disease and her cousin passed away recently at age 38 due to a massive heart attack.  We will place referral to cardiology per patient request.

## 2022-03-11 NOTE — Assessment & Plan Note (Addendum)
Chronic, ongoing.  She recently had 2 deaths in her family which exacerbated her symptoms.  Her PHQ-9 is as 0 and her GAD-7 is a 5.  She is taking propanolol 20 mg as needed for anxiety which helps her symptoms.  She does not feel like she needs any other medication at this time.  Follow-up if symptoms worsen or with any concerns.

## 2022-03-11 NOTE — Assessment & Plan Note (Signed)
She has been feeling sensitivity to the cold.  She states that when the [facility] is on it 75 she stays cold in her house.  We will check an iron panel, CBC, and TSH today.

## 2022-03-11 NOTE — Assessment & Plan Note (Addendum)
He has been having pain in her right heel for the past several years that is gotten worse recently.  The pain feels like somebody is stabbing her and happens when she stands on her feet too long.  We will check an x-ray to rule out heel spur.  She can take tylenol or ibuprofen as needed for pain. Follow-up after imaging.

## 2022-03-11 NOTE — Assessment & Plan Note (Signed)
Chronic, stable.  She did not take her medicine this morning.  BP today 124/90.  Continue amlodipine 5 mg daily.  Follow-up in 6 months

## 2022-03-11 NOTE — Assessment & Plan Note (Signed)
She has a history of elevated cholesterol, will recheck fasting lipid panel today

## 2022-03-13 ENCOUNTER — Other Ambulatory Visit: Payer: BC Managed Care – PPO

## 2022-03-14 ENCOUNTER — Encounter: Payer: Self-pay | Admitting: Nurse Practitioner

## 2022-03-14 LAB — CYTOLOGY - PAP
Adequacy: ABSENT
Comment: NEGATIVE
Diagnosis: NEGATIVE
High risk HPV: NEGATIVE

## 2022-03-14 MED ORDER — METRONIDAZOLE 500 MG PO TABS: 500.0000 mg | ORAL_TABLET | Freq: Two times a day (BID) | ORAL | 0 refills | Status: AC

## 2022-03-24 ENCOUNTER — Ambulatory Visit (INDEPENDENT_AMBULATORY_CARE_PROVIDER_SITE_OTHER): Payer: BC Managed Care – PPO

## 2022-03-24 ENCOUNTER — Ambulatory Visit (INDEPENDENT_AMBULATORY_CARE_PROVIDER_SITE_OTHER): Payer: BC Managed Care – PPO | Admitting: Podiatry

## 2022-03-24 DIAGNOSIS — M722 Plantar fascial fibromatosis: Secondary | ICD-10-CM

## 2022-03-24 DIAGNOSIS — M79671 Pain in right foot: Secondary | ICD-10-CM

## 2022-03-24 DIAGNOSIS — M7731 Calcaneal spur, right foot: Secondary | ICD-10-CM

## 2022-03-24 MED ORDER — MELOXICAM 15 MG PO TABS: 15.0000 mg | ORAL_TABLET | Freq: Every day | ORAL | 0 refills | Status: DC | PRN

## 2022-03-24 MED ORDER — METHYLPREDNISOLONE 4 MG PO TBPK: ORAL_TABLET | ORAL | 0 refills | Status: DC

## 2022-03-24 NOTE — Patient Instructions (Signed)
If was nice to meet you today. If you have any questions or any further concerns, please feel fee to give me a call. You can call our office at [phone] or please feel fee to send me a message through MyChart.   Start with the steroid (medrol dose pack) and once complete you can start the meloxicam to use as needed.   For inserts I like Powersteps, superfeet, and aetrex.   For instructions on how to put on your Plantar Fascial Brace, please visit [email or web address]  Plantar Fasciitis (Heel Spur Syndrome) with Rehab The plantar fascia is a fibrous, ligament-like, soft-tissue structure that spans the bottom of the foot. Plantar fasciitis is a condition that causes pain in the foot due to inflammation of the tissue. SYMPTOMS  Pain and tenderness on the underneath side of the foot. Pain that worsens with standing or walking. CAUSES  Plantar fasciitis is caused by irritation and injury to the plantar fascia on the underneath side of the foot. Common mechanisms of injury include: Direct trauma to bottom of the foot. Damage to a small nerve that runs under the foot where the main fascia attaches to the heel bone. Stress placed on the plantar fascia due to bone spurs. RISK INCREASES WITH:  Activities that place stress on the plantar fascia (running, jumping, pivoting, or cutting). Poor strength and flexibility. Improperly fitted shoes. Tight calf muscles. Flat feet. Failure to warm-up properly before activity. Obesity. PREVENTION Warm up and stretch properly before activity. Allow for adequate recovery between workouts. Maintain physical fitness: Strength, flexibility, and endurance. Cardiovascular fitness. Maintain a health body weight. Avoid stress on the plantar fascia. Wear properly fitted shoes, including arch supports for individuals who have flat feet.  PROGNOSIS  If treated properly, then the symptoms of plantar fasciitis usually resolve without surgery. However,  occasionally surgery is necessary.  RELATED COMPLICATIONS  Recurrent symptoms that may result in a chronic condition. Problems of the lower back that are caused by compensating for the injury, such as limping. Pain or weakness of the foot during push-off following surgery. Chronic inflammation, scarring, and partial or complete fascia tear, occurring more often from repeated injections.  TREATMENT  Treatment initially involves the use of ice and medication to help reduce pain and inflammation. The use of strengthening and stretching exercises may help reduce pain with activity, especially stretches of the Achilles tendon. These exercises may be performed at home or with a therapist. Your caregiver may recommend that you use heel cups of arch supports to help reduce stress on the plantar fascia. Occasionally, corticosteroid injections are given to reduce inflammation. If symptoms persist for greater than 6 months despite non-surgical (conservative), then surgery may be recommended.   MEDICATION  If pain medication is necessary, then nonsteroidal anti-inflammatory medications, such as aspirin and ibuprofen, or other minor pain relievers, such as acetaminophen, are often recommended. Do not take pain medication within 7 days before surgery. Prescription pain relievers may be given if deemed necessary by your caregiver. Use only as directed and only as much as you need. Corticosteroid injections may be given by your caregiver. These injections should be reserved for the most serious cases, because they may only be given a certain number of times.  HEAT AND COLD Cold treatment (icing) relieves pain and reduces inflammation. Cold treatment should be applied for 10 to 15 minutes every 2 to 3 hours for inflammation and pain and immediately after any activity that aggravates your symptoms. Use ice packs or massage the area  with a piece of ice (ice massage). Heat treatment may be used prior to performing  the stretching and strengthening activities prescribed by your caregiver, physical therapist, or athletic trainer. Use a heat pack or soak the injury in warm water.  SEEK IMMEDIATE MEDICAL CARE IF: Treatment seems to offer no benefit, or the condition worsens. Any medications produce adverse side effects.  EXERCISES- RANGE OF MOTION (ROM) AND STRETCHING EXERCISES - Plantar Fasciitis (Heel Spur Syndrome) These exercises may help you when beginning to rehabilitate your injury. Your symptoms may resolve with or without further involvement from your physician, physical therapist or athletic trainer. While completing these exercises, remember:  Restoring tissue flexibility helps normal motion to return to the joints. This allows healthier, less painful movement and activity. An effective stretch should be held for at least 30 seconds. A stretch should never be painful. You should only feel a gentle lengthening or release in the stretched tissue.  RANGE OF MOTION - Toe Extension, Flexion Sit with your right / left leg crossed over your opposite knee. Grasp your toes and gently pull them back toward the top of your foot. You should feel a stretch on the bottom of your toes and/or foot. Hold this stretch for 10 seconds. Now, gently pull your toes toward the bottom of your foot. You should feel a stretch on the top of your toes and or foot. Hold this stretch for 10 seconds. Repeat  times. Complete this stretch 3 times per day.   RANGE OF MOTION - Ankle Dorsiflexion, Active Assisted Remove shoes and sit on a chair that is preferably not on a carpeted surface. Place right / left foot under knee. Extend your opposite leg for support. Keeping your heel down, slide your right / left foot back toward the chair until you feel a stretch at your ankle or calf. If you do not feel a stretch, slide your bottom forward to the edge of the chair, while still keeping your heel down. Hold this stretch for 10  seconds. Repeat 3 times. Complete this stretch 2 times per day.   STRETCH  Gastroc, Standing Place hands on wall. Extend right / left leg, keeping the front knee somewhat bent. Slightly point your toes inward on your back foot. Keeping your right / left heel on the floor and your knee straight, shift your weight toward the wall, not allowing your back to arch. You should feel a gentle stretch in the right / left calf. Hold this position for 10 seconds. Repeat 3 times. Complete this stretch 2 times per day.  STRETCH  Soleus, Standing Place hands on wall. Extend right / left leg, keeping the other knee somewhat bent. Slightly point your toes inward on your back foot. Keep your right / left heel on the floor, bend your back knee, and slightly shift your weight over the back leg so that you feel a gentle stretch deep in your back calf. Hold this position for 10 seconds. Repeat 3 times. Complete this stretch 2 times per day.  STRETCH  Gastrocsoleus, Standing  Note: This exercise can place a lot of stress on your foot and ankle. Please complete this exercise only if specifically instructed by your caregiver.  Place the ball of your right / left foot on a step, keeping your other foot firmly on the same step. Hold on to the wall or a rail for balance. Slowly lift your other foot, allowing your body weight to press your heel down over the edge of  the step. You should feel a stretch in your right / left calf. Hold this position for 10 seconds. Repeat this exercise with a slight bend in your right / left knee. Repeat 3 times. Complete this stretch 2 times per day.   STRENGTHENING EXERCISES - Plantar Fasciitis (Heel Spur Syndrome)  These exercises may help you when beginning to rehabilitate your injury. They may resolve your symptoms with or without further involvement from your physician, physical therapist or athletic trainer. While completing these exercises, remember:  Muscles can gain both  the endurance and the strength needed for everyday activities through controlled exercises. Complete these exercises as instructed by your physician, physical therapist or athletic trainer. Progress the resistance and repetitions only as guided.  STRENGTH - Towel Curls Sit in a chair positioned on a non-carpeted surface. Place your foot on a towel, keeping your heel on the floor. Pull the towel toward your heel by only curling your toes. Keep your heel on the floor. Repeat 3 times. Complete this exercise 2 times per day.  STRENGTH - Ankle Inversion Secure one end of a rubber exercise band/tubing to a fixed object (table, pole). Loop the other end around your foot just before your toes. Place your fists between your knees. This will focus your strengthening at your ankle. Slowly, pull your big toe up and in, making sure the band/tubing is positioned to resist the entire motion. Hold this position for 10 seconds. Have your muscles resist the band/tubing as it slowly pulls your foot back to the starting position. Repeat 3 times. Complete this exercises 2 times per day.  Document Released: 08/11/2005 Document Revised: 11/03/2011 Document Reviewed: 11/23/2008 [facility] Patient Information 2014 [place], [place].

## 2022-03-24 NOTE — Progress Notes (Unsigned)
Subjective:   Patient ID: Hailey Olson, female   DOB: 36 y.o.   MRN: [id number]   HPI Chief Complaint  Patient presents with   Foot Pain    Right heel pain which started 1 year ago, feels like stepping on a nail, medial side of the heel, Rate of pain is a9 out of 10 when standing for work, X-Rays were taken  March 10 2022    No recent treatment. She discussed her her PCP and got a x-ray and referred for following. No swelling. No numbness. She works on her feet all the time and when she is on the feet a lot it hurts or trhobs when she sits. It feels OK in the morning but sometimes it is sore depending on how much she is on her feet. She did get Dr. Regino Schultze inserts, made it hurt worse. No injury.    ROS      Objective:  Physical Exam  ***     Assessment:  ***     Plan:  ***    Pf brace

## 2022-03-26 ENCOUNTER — Other Ambulatory Visit: Payer: BC Managed Care – PPO

## 2022-04-02 ENCOUNTER — Ambulatory Visit: Payer: BC Managed Care – PPO | Admitting: Cardiovascular Disease

## 2022-04-16 ENCOUNTER — Other Ambulatory Visit: Payer: Self-pay | Admitting: Podiatry

## 2022-04-16 DIAGNOSIS — M722 Plantar fascial fibromatosis: Secondary | ICD-10-CM

## 2022-04-17 ENCOUNTER — Other Ambulatory Visit: Payer: Self-pay | Admitting: Nurse Practitioner

## 2022-04-17 DIAGNOSIS — I10 Essential (primary) hypertension: Secondary | ICD-10-CM

## 2022-05-02 ENCOUNTER — Ambulatory Visit: Payer: BC Managed Care – PPO | Attending: Cardiovascular Disease | Admitting: Cardiovascular Disease

## 2022-05-02 ENCOUNTER — Encounter: Payer: Self-pay | Admitting: Cardiovascular Disease

## 2022-05-02 VITALS — BP 132/88 | HR 80 | Ht 67.0 in | Wt 250.8 lb

## 2022-05-02 DIAGNOSIS — E785 Hyperlipidemia, unspecified: Secondary | ICD-10-CM | POA: Diagnosis not present

## 2022-05-02 DIAGNOSIS — I10 Essential (primary) hypertension: Secondary | ICD-10-CM

## 2022-05-02 DIAGNOSIS — R0789 Other chest pain: Secondary | ICD-10-CM

## 2022-05-02 NOTE — Progress Notes (Signed)
Cardiology Office Note:    Date:  05/02/2022   ID:  Hailey Olson, DOB [birth year], MRN [id number]  PCP:  Gerre Scull, NP   [facility] Providers Cardiologist:  None { Click to update primary MD,subspecialty MD or APP then REFRESH:1}    Referring MD: Gerre Scull, NP   No chief complaint on file. ***  History of Present Illness:    Hailey Olson is a 36 y.o. female with a hx of ***  Past Medical History:  Diagnosis Date   Anxiety    Hypertension     Past Surgical History:  Procedure Laterality Date   DILATION AND CURETTAGE OF UTERUS     for 2nd trimester loss.   parturition  2014 and 2015    Current Medications: Current Meds  Medication Sig   amLODipine (NORVASC) 5 MG tablet TAKE 1 TABLET BY MOUTH ONCE DAILY   propranolol (INDERAL) 20 MG tablet TAKE 1 TABLET BY MOUTH 3 TIMES DAILY AS NEEDED.     Allergies:   Patient has no known allergies.   Social History   Socioeconomic History   Marital status: Single    Spouse name: Not on file   Number of children: Not on file   Years of education: Not on file   Highest education level: Not on file  Occupational History   Not on file  Tobacco Use   Smoking status: Former    Packs/day: 0.25    Types: Cigarettes   Smokeless tobacco: Never  Vaping Use   Vaping Use: Every day   Substances: Nicotine  Substance and Sexual Activity   Alcohol use: No   Drug use: No   Sexual activity: Yes    Comment: last intercourse yesterday  Other Topics Concern   Not on file  Social History Narrative   Not on file   Social Determinants of Health   Financial Resource Strain: Not on file  Food Insecurity: Not on file  Transportation Needs: Not on file  Physical Activity: Not on file  Stress: Not on file  Social Connections: Not on file     Family History: The patient's ***family history includes Diabetes in her mother; Heart attack in her paternal grandmother; Heart attack (age of onset: 9) in an other  family member; Hyperlipidemia in her father; Hypertension in her mother; Stroke in her paternal grandmother.  ROS:   Please see the history of present illness.    *** All other systems reviewed and are negative.  EKGs/Labs/Other Studies Reviewed:    The following studies were reviewed today: ***  EKG:  EKG is *** ordered today.  The ekg ordered today demonstrates ***  Recent Labs: 10/11/2021: ALT 9; BUN 12; Creat 0.94; Hemoglobin 11.7; Platelets 341; Potassium 3.9; Sodium 140; TSH 0.98  Recent Lipid Panel    Component Value Date/Time   CHOL 217 (H) 10/11/2021 1456   TRIG 176 (H) 10/11/2021 1456   HDL 40 (L) 10/11/2021 1456   CHOLHDL 5.4 (H) 10/11/2021 1456   LDLCALC 146 (H) 10/11/2021 1456     Risk Assessment/Calculations:   {Does this patient have ATRIAL FIBRILLATION?:[id number]}            Physical Exam:    VS:  BP 132/88 (BP Location: Left Arm, Patient Position: Sitting, Cuff Size: Large)   Pulse 80   Ht 5\' 7"  (1.702 m)   Wt 250 lb 12.8 oz (113.8 kg)   SpO2 95%   BMI 39.28 kg/m     Wt  Readings from Last 3 Encounters:  05/02/22 250 lb 12.8 oz (113.8 kg)  03/10/22 247 lb 3.2 oz (112.1 kg)  10/11/21 247 lb 6.4 oz (112.2 kg)     GEN: *** Well nourished, well developed in no acute distress HEENT: Normal NECK: No JVD; No carotid bruits LYMPHATICS: No lymphadenopathy CARDIAC: ***RRR, no murmurs, rubs, gallops RESPIRATORY:  Clear to auscultation without rales, wheezing or rhonchi  ABDOMEN: Soft, non-tender, non-distended MUSCULOSKELETAL:  No edema; No deformity  SKIN: Warm and dry NEUROLOGIC:  Alert and oriented x 3 PSYCHIATRIC:  Normal affect   ASSESSMENT:    No diagnosis found. PLAN:    In order of problems listed above:  ***      {Are you ordering a CV Procedure (e.g. stress test, cath, DCCV, TEE, etc)?   Press F2        :[id number]}    Medication Adjustments/Labs and Tests Ordered: Current medicines are reviewed at length with the patient  today.  Concerns regarding medicines are outlined above.  No orders of the defined types were placed in this encounter.  No orders of the defined types were placed in this encounter.   There are no Patient Instructions on file for this visit.   Signed, Thurmon Fair, MD  05/02/2022 4:19 PM    [facility]

## 2022-05-02 NOTE — Patient Instructions (Signed)
Medication Instructions:  No changes *If you need a refill on your cardiac medications before your next appointment, please call your pharmacy*   Lab Work: Your provider would like for you to return in 6 months before your next appointment to have the following labs drawn: fasting NMR. You do not need an appointment for the lab. Once in our office lobby there is a podium where you can sign in and ring the doorbell to alert [place] that you are here. The lab is open from 8:00 am to 4 pm; closed for lunch from 12:45pm-1:45pm.  You may also go to any of these [facility] locations:   [facility] - [address] ([facility]) - [address] [phone] [address]     If you have labs (blood work) drawn today and your tests are completely normal, you will receive your results only by: [employer] (if you have MyChart) OR A paper copy in the mail If you have any lab test that is abnormal or we need to change your treatment, we will call you to review the results.   Testing/Procedures: None ordered   Follow-Up: At [facility], you and your health needs are our priority.  As part of our continuing mission to provide you with exceptional heart care, we have created designated Provider Care Teams.  These Care Teams include your primary Cardiologist (physician) and Advanced Practice Providers (APPs -  Physician Assistants and Nurse Practitioners) who all work together to provide you with the care you need, when you need it.  We recommend signing up for the patient portal called "MyChart".  Sign up information is provided on this After Visit Summary.  MyChart is used to connect with patients for Virtual Visits (Telemedicine).  Patients are able to view lab/test results, encounter notes, upcoming appointments, etc.  Non-urgent messages can be sent to your provider as well.   To learn more about what you can do with MyChart, go to  [email or web address].    Your next appointment:   6 month(s)  The format for your next appointment:   In Person  Provider:   Thurmon Fair, MD     Important Information About Sugar

## 2022-05-04 ENCOUNTER — Encounter: Payer: Self-pay | Admitting: Cardiovascular Disease

## 2022-06-12 ENCOUNTER — Other Ambulatory Visit: Payer: Self-pay | Admitting: Nurse Practitioner

## 2022-09-10 ENCOUNTER — Ambulatory Visit: Payer: BC Managed Care – PPO | Admitting: Nurse Practitioner

## 2022-09-18 ENCOUNTER — Encounter: Payer: Self-pay | Admitting: Nurse Practitioner

## 2022-09-18 ENCOUNTER — Ambulatory Visit: Payer: BC Managed Care – PPO | Admitting: Nurse Practitioner

## 2022-09-18 VITALS — BP 140/84 | HR 92 | Ht 67.0 in | Wt 248.4 lb

## 2022-09-18 DIAGNOSIS — E782 Mixed hyperlipidemia: Secondary | ICD-10-CM

## 2022-09-18 DIAGNOSIS — E041 Nontoxic single thyroid nodule: Secondary | ICD-10-CM

## 2022-09-18 DIAGNOSIS — I10 Essential (primary) hypertension: Secondary | ICD-10-CM

## 2022-09-18 DIAGNOSIS — R7301 Impaired fasting glucose: Secondary | ICD-10-CM

## 2022-09-18 MED ORDER — AMLODIPINE BESYLATE 10 MG PO TABS: 10.0000 mg | ORAL_TABLET | Freq: Every day | ORAL | 0 refills | Status: DC

## 2022-09-18 MED ORDER — CONTRAVE 8-90 MG PO TB12: ORAL_TABLET | ORAL | 0 refills | Status: DC

## 2022-09-18 NOTE — Progress Notes (Signed)
Established Patient Office Visit  Subjective   Patient ID: Hailey Olson, female    DOB: 1986/03/25  Age: 37 y.o. MRN: [id number]  Chief Complaint  Patient presents with   Follow-up    6 month follow up ,htn  headaches  and dizziness  and some tight in her chest , she did follow up cardiology , think she needs go up on medication for her b/p , she has been check b/p , need refills on b/p     HPI  Georganna Maxson is here to follow-up on hypertension and hyperlipidemia. She has been trying to lose weight and has been having trouble. She states that she has been eating 2 meals per day and is on her feet all day for her job. She was losing weight, however then she had the IUD inserted and gained weight and is now having trouble using it.   HYPERTENSION / HYPERLIPIDEMIA  Satisfied with current treatment? yes Duration of hypertension: chronic BP monitoring frequency: not checking BP range: n/a BP medication side effects: no Past BP meds: amlodipine  Duration of hyperlipidemia: years Cholesterol medication side effects:  n/a Cholesterol supplements: none Past cholesterol medications: none Medication compliance: excellent compliance Aspirin: no Recent stressors: yes Recurrent headaches: yes Visual changes: no Palpitations: no Dyspnea: no Chest pain: no Lower extremity edema: no Dizzy/lightheaded: no  ROS See pertinent positives and negatives per HPI.    Objective:     BP (!) 140/84 (BP Location: Right Arm, Cuff Size: Large)   Pulse 92   Ht 5\' 7"  (1.702 m)   Wt 248 lb 6.4 oz (112.7 kg)   SpO2 96%   BMI 38.90 kg/m     Physical Exam Vitals and nursing note reviewed.  Constitutional:      General: She is not in acute distress.    Appearance: Normal appearance. She is obese.  HENT:     Head: Normocephalic.  Eyes:     Conjunctiva/sclera: Conjunctivae normal.  Cardiovascular:     Rate and Rhythm: Normal rate and regular rhythm.     Pulses: Normal pulses.     Heart  sounds: Normal heart sounds.  Pulmonary:     Effort: Pulmonary effort is normal.     Breath sounds: Normal breath sounds.  Musculoskeletal:     Cervical back: Normal range of motion.  Skin:    General: Skin is warm.  Neurological:     General: No focal deficit present.     Mental Status: She is alert and oriented to person, place, and time.  Psychiatric:        Mood and Affect: Mood normal.        Behavior: Behavior normal.        Thought Content: Thought content normal.        Judgment: Judgment normal.      Results for orders placed or performed in visit on 09/18/22  Comprehensive metabolic panel  Result Value Ref Range   Sodium 141 135 - 145 mEq/L   Potassium 3.4 (L) 3.5 - 5.1 mEq/L   Chloride 107 96 - 112 mEq/L   CO2 25 19 - 32 mEq/L   Glucose, Bld 129 (H) 70 - 99 mg/dL   BUN 10 6 - 23 mg/dL   Creatinine, Ser 0.85 0.40 - 1.20 mg/dL   Total Bilirubin 0.3 0.2 - 1.2 mg/dL   Alkaline Phosphatase 45 39 - 117 U/L   AST 9 0 - 37 U/L   ALT 8 0 - 35  U/L   Total Protein 6.7 6.0 - 8.3 g/dL   Albumin 3.9 3.5 - 5.2 g/dL   GFR 86.83 >60.00 mL/min   Calcium 8.8 8.4 - 10.5 mg/dL  CBC  Result Value Ref Range   WBC 6.8 4.0 - 10.5 K/uL   RBC 4.06 3.87 - 5.11 Mil/uL   Platelets 313.0 150.0 - 400.0 K/uL   Hemoglobin 11.6 (L) 12.0 - 15.0 g/dL   HCT 35.1 (L) 36.0 - 46.0 %   MCV 86.4 78.0 - 100.0 fl   MCHC 33.0 30.0 - 36.0 g/dL   RDW 14.4 11.5 - 15.5 %  Lipid panel  Result Value Ref Range   Cholesterol 201 (H) 0 - 200 mg/dL   Triglycerides 227.0 (H) 0.0 - 149.0 mg/dL   HDL 41.70 >39.00 mg/dL   VLDL 45.4 (H) 0.0 - 40.0 mg/dL   Total CHOL/HDL Ratio 5    NonHDL 159.16   TSH  Result Value Ref Range   TSH 0.66 0.35 - 5.50 uIU/mL  LDL cholesterol, direct  Result Value Ref Range   Direct LDL 127.0 mg/dL       Assessment & Plan:   Problem List Items Addressed This Visit       Cardiovascular and Mediastinum   Primary hypertension - Primary    Chronic, not controlled.  Her  BP today is 140/84.  She is having some headaches and dizziness which may be from elevated blood pressure.  Will increase her amlodipine to 10 mg daily.  Check CMP, CBC today.  Follow-up in 4 weeks.      Relevant Medications   amLODipine (NORVASC) 10 MG tablet   Other Relevant Orders   Comprehensive metabolic panel (Completed)   CBC (Completed)     Endocrine   Thyroid nodule    Thyroid nodule noted on imaging 2 years ago.  Ordered a follow-up ultrasound, however that was not scheduled.  Will check TSH today.      Relevant Orders   TSH (Completed)     Other   Mixed hyperlipidemia    Will check lipid panel today.  Encourage weight loss and increased exercise.      Relevant Medications   amLODipine (NORVASC) 10 MG tablet   Other Relevant Orders   Lipid panel (Completed)   Morbid obesity (HCC)    BMI is 38.9 and this is associated with hypertension and hyperlipidemia.  She is interested in medication to help with weight loss.  She usually eats about 2 meals per day and is on her feet all day at her job.  She states that she was losing weight until she had her IUD inserted and now she has been having trouble losing weight.  Discussed medication options and she would like to try Contrave.  Will have her start this 1 tablet daily for 7 days, then increase to 1 tablet twice a day.  Follow-up in 4 weeks.      Relevant Medications   Naltrexone-buPROPion HCl ER (CONTRAVE) 8-90 MG TB12   Other Visit Diagnoses     IFG (impaired fasting glucose)       Will check A1c today.   Relevant Orders   Hemoglobin A1c       Return in about 4 weeks (around 10/16/2022) for HTN, weight management.    Charyl Dancer, NP

## 2022-09-18 NOTE — Patient Instructions (Addendum)
It was great to see you!  Start contrave 1 tablet daily for 7 days, then 1 tablet twice a day at breakfast and lunch.   Increase your amlodipine to 10mg  daily.   Try prilosec over the counter to see if this will help with your throat.   Let's follow-up in 4 weeks, sooner if you have concerns.  If a referral was placed today, you will be contacted for an appointment. Please note that routine referrals can sometimes take up to 3-4 weeks to process. Please call our office if you haven't heard anything after this time frame.  Take care,  Vance Peper, NP

## 2022-09-19 ENCOUNTER — Other Ambulatory Visit (INDEPENDENT_AMBULATORY_CARE_PROVIDER_SITE_OTHER): Payer: BC Managed Care – PPO

## 2022-09-19 DIAGNOSIS — R7301 Impaired fasting glucose: Secondary | ICD-10-CM

## 2022-09-19 LAB — COMPREHENSIVE METABOLIC PANEL
ALT: 8 U/L (ref 0–35)
AST: 9 U/L (ref 0–37)
Albumin: 3.9 g/dL (ref 3.5–5.2)
Alkaline Phosphatase: 45 U/L (ref 39–117)
BUN: 10 mg/dL (ref 6–23)
CO2: 25 mEq/L (ref 19–32)
Calcium: 8.8 mg/dL (ref 8.4–10.5)
Chloride: 107 mEq/L (ref 96–112)
Creatinine, Ser: 0.85 mg/dL (ref 0.40–1.20)
GFR: 86.83 mL/min (ref >60.00)
Glucose, Bld: 129 mg/dL — ABNORMAL HIGH (ref 70–99)
Potassium: 3.4 mEq/L — ABNORMAL LOW (ref 3.5–5.1)
Sodium: 141 mEq/L (ref 135–145)
Total Bilirubin: 0.3 mg/dL (ref 0.2–1.2)
Total Protein: 6.7 g/dL (ref 6.0–8.3)

## 2022-09-19 LAB — LIPID PANEL
Cholesterol: 201 mg/dL — ABNORMAL HIGH (ref 0–200)
HDL: 41.7 mg/dL (ref >39.00)
NonHDL: 159.16
Total CHOL/HDL Ratio: 5
Triglycerides: 227 mg/dL — ABNORMAL HIGH (ref 0.0–149.0)
VLDL: 45.4 mg/dL — ABNORMAL HIGH (ref 0.0–40.0)

## 2022-09-19 LAB — LDL CHOLESTEROL, DIRECT: Direct LDL: 127 mg/dL

## 2022-09-19 LAB — CBC
HCT: 35.1 % — ABNORMAL LOW (ref 36.0–46.0)
Hemoglobin: 11.6 g/dL — ABNORMAL LOW (ref 12.0–15.0)
MCHC: 33 g/dL (ref 30.0–36.0)
MCV: 86.4 fl (ref 78.0–100.0)
Platelets: 313 10*3/uL (ref 150.0–400.0)
RBC: 4.06 Mil/uL (ref 3.87–5.11)
RDW: 14.4 % (ref 11.5–15.5)
WBC: 6.8 10*3/uL (ref 4.0–10.5)

## 2022-09-19 LAB — TSH: TSH: 0.66 u[IU]/mL (ref 0.35–5.50)

## 2022-09-19 LAB — HEMOGLOBIN A1C: Hgb A1c MFr Bld: 5.9 % (ref 4.6–6.5)

## 2022-09-19 NOTE — Assessment & Plan Note (Signed)
Chronic, not controlled.  Her BP today is 140/84.  She is having some headaches and dizziness which may be from elevated blood pressure.  Will increase her amlodipine to 10 mg daily.  Check CMP, CBC today.  Follow-up in 4 weeks.

## 2022-09-19 NOTE — Assessment & Plan Note (Signed)
BMI is 38.9 and this is associated with hypertension and hyperlipidemia.  She is interested in medication to help with weight loss.  She usually eats about 2 meals per day and is on her feet all day at her job.  She states that she was losing weight until she had her IUD inserted and now she has been having trouble losing weight.  Discussed medication options and she would like to try Contrave.  Will have her start this 1 tablet daily for 7 days, then increase to 1 tablet twice a day.  Follow-up in 4 weeks.

## 2022-09-19 NOTE — Assessment & Plan Note (Signed)
Will check lipid panel today.  Encourage weight loss and increased exercise.

## 2022-09-19 NOTE — Assessment & Plan Note (Signed)
Thyroid nodule noted on imaging 2 years ago.  Ordered a follow-up ultrasound, however that was not scheduled.  Will check TSH today.

## 2022-10-16 ENCOUNTER — Ambulatory Visit: Payer: BC Managed Care – PPO | Admitting: Nurse Practitioner

## 2022-11-05 ENCOUNTER — Other Ambulatory Visit: Payer: Self-pay | Admitting: Nurse Practitioner

## 2022-11-05 DIAGNOSIS — I10 Essential (primary) hypertension: Secondary | ICD-10-CM

## 2022-11-26 ENCOUNTER — Ambulatory Visit: Payer: BC Managed Care – PPO | Admitting: Nurse Practitioner

## 2022-11-26 ENCOUNTER — Telehealth: Payer: Self-pay | Admitting: Nurse Practitioner

## 2022-11-26 NOTE — Telephone Encounter (Signed)
4.3.24 no show letter sent

## 2022-11-27 NOTE — Telephone Encounter (Signed)
1st no show, fee waived, letter and text sent

## 2022-11-28 NOTE — Telephone Encounter (Signed)
Noted  

## 2022-12-17 ENCOUNTER — Other Ambulatory Visit: Payer: Self-pay | Admitting: Nurse Practitioner

## 2023-01-29 ENCOUNTER — Telehealth: Payer: Self-pay | Admitting: Nurse Practitioner

## 2023-01-29 ENCOUNTER — Ambulatory Visit: Payer: BC Managed Care – PPO | Admitting: Nurse Practitioner

## 2023-01-29 NOTE — Telephone Encounter (Signed)
6.6.24 no show letter sent

## 2023-01-30 NOTE — Telephone Encounter (Signed)
2nd no show, fee generated, final warning sent via mail and mychart, text sent 

## 2023-01-30 NOTE — Telephone Encounter (Signed)
Noted  

## 2023-02-22 IMAGING — RF DG UGI W/ HIGH DENSITY W/O KUB
14 series · 14 of 24 positions shown · non-contrast
Comparison: None.

CLINICAL DATA: Globus sensation

EXAM:
UPPER GI SERIES WITH KUB
TECHNIQUE: After obtaining a scout radiograph a routine upper GI series was
performed using thin and high density barium.
FLUOROSCOPY TIME:  Fluoroscopy Time:  3 minutes, 36 seconds
Radiation Exposure Index (if provided by the fluoroscopic device):
104.7 mGy
Number of Acquired Spot Images: 5

[Series 1: one shot · 0.14mm/px · 1 of 2 slices shown (1 of 2)]
[im 1/2]
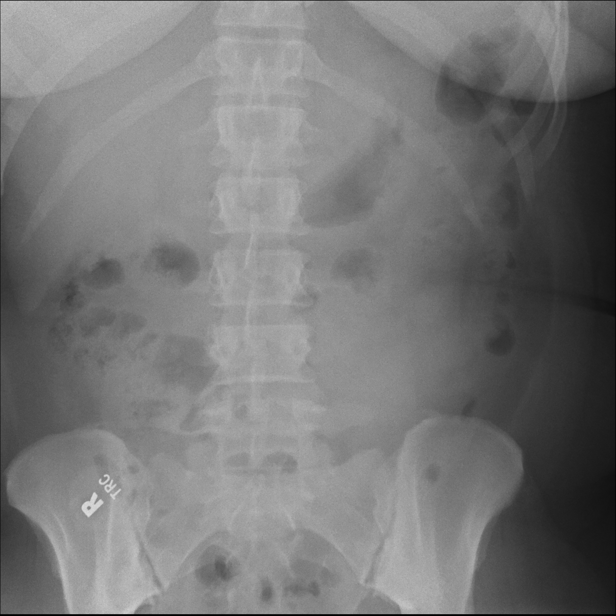

[Series 2: sequence · 1 of 45 frames shown (1 of 12)]
[frame 39/45]
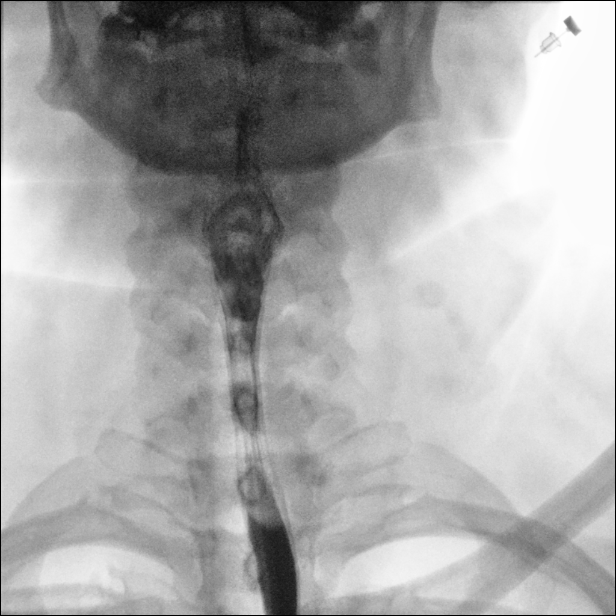

[Series 4: sequence · 1 of 40 frames shown (2 of 12)]
[frame 35/40]
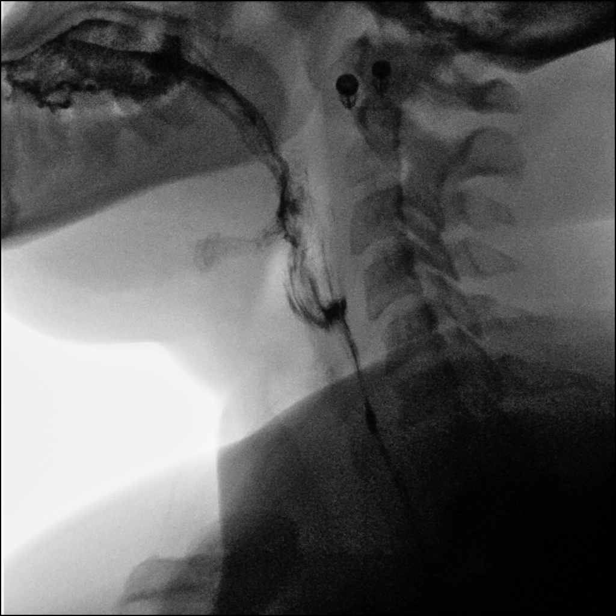

[Series 6: sequence · 1 of 180 frames shown (3 of 12)]
[frame 91/180]
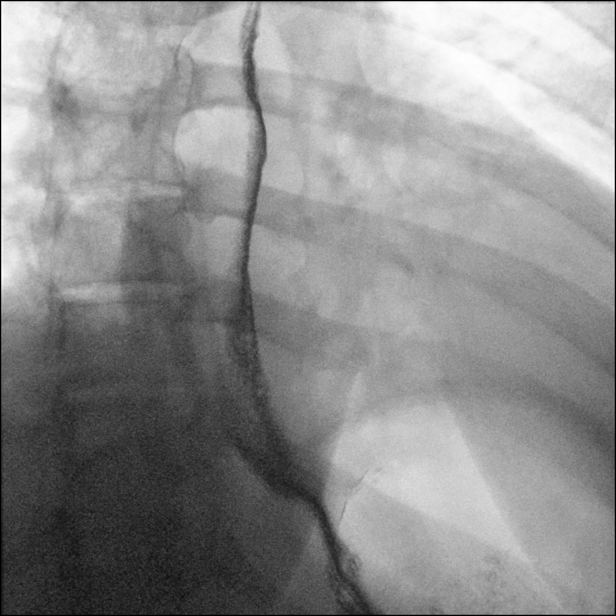

[Series 7: one shot · 0.16mm/px · 1 of 5 slices shown (2 of 2)]
[im 2/5]
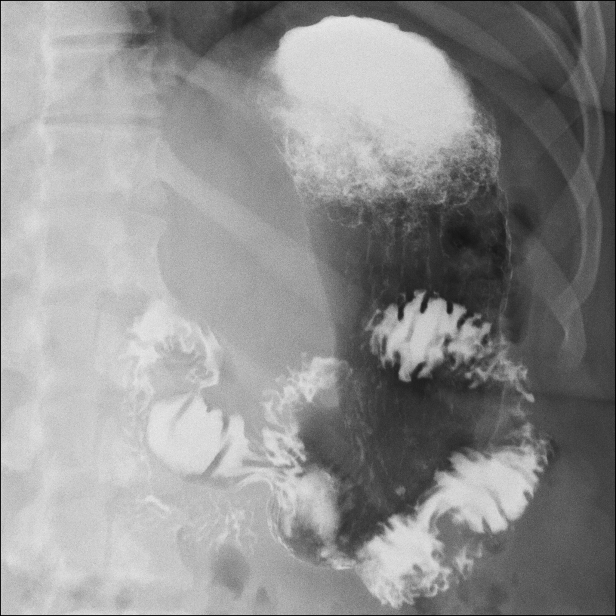

[Series 8: sequence · 1 of 108 frames shown (4 of 12)]
[frame 55/108]
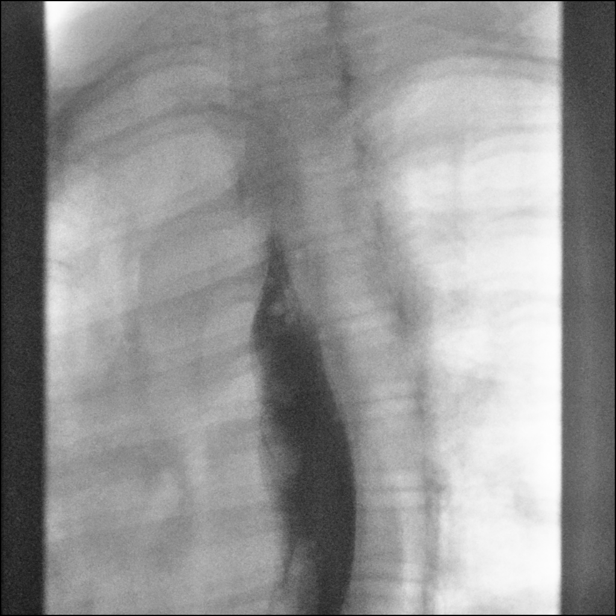

[Series 9: sequence · 1 of 108 frames shown (5 of 12)]
[frame 55/108]
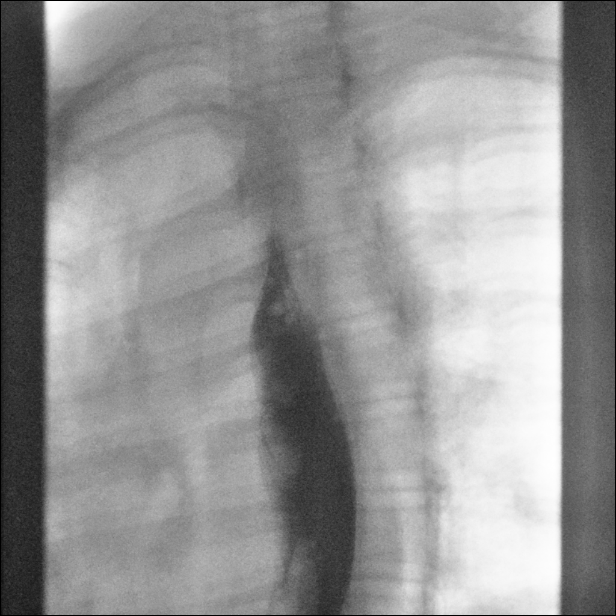

[Series 10: sequence · 1 of 105 frames shown (6 of 12)]
[frame 16/105]
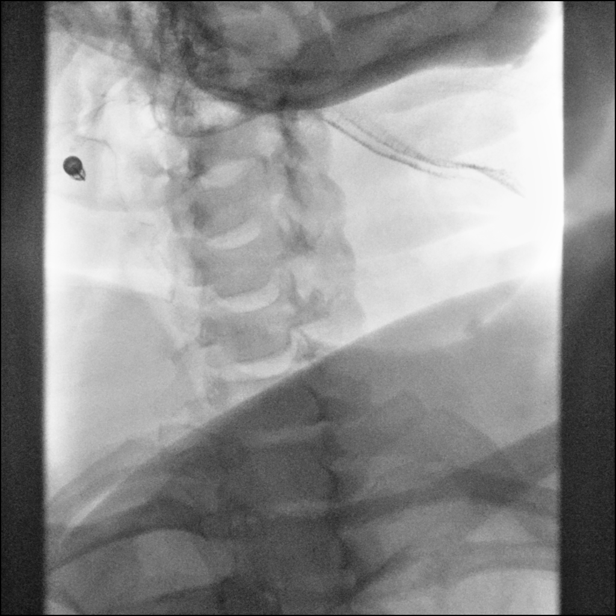

[Series 11: sequence · 1 of 93 frames shown (7 of 12)]
[frame 14/93]
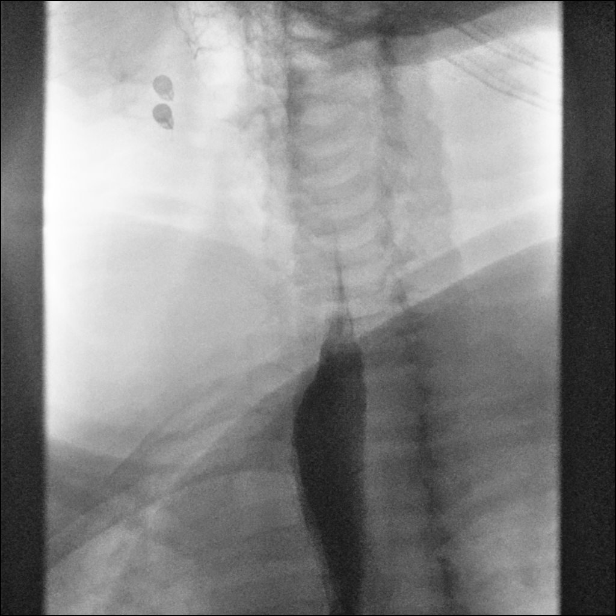

[Series 12: sequence · 1 of 127 frames shown (8 of 12)]
[frame 64/127]
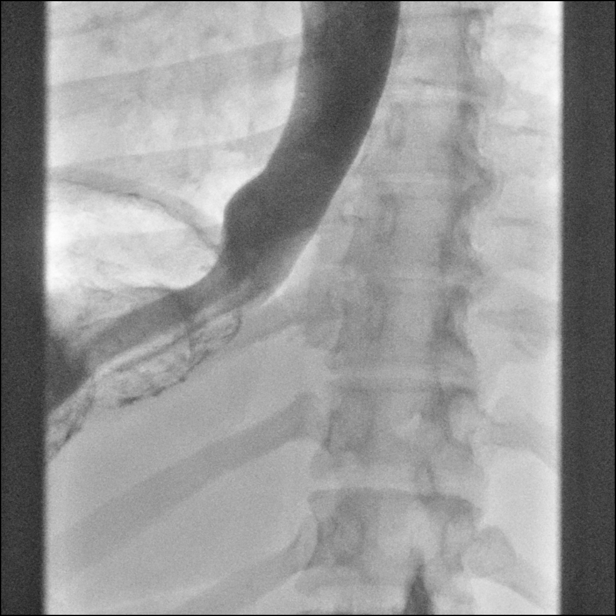

[Series 13: sequence · 1 of 112 frames shown (9 of 12)]
[frame 96/112]
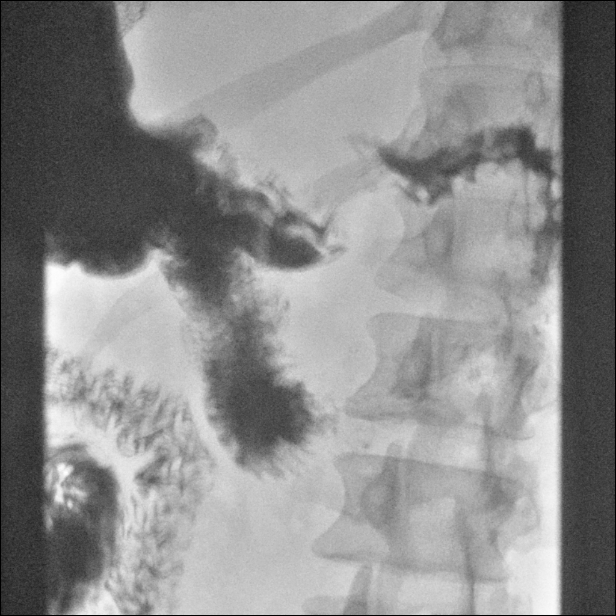

[Series 14: sequence · 1 of 91 frames shown (10 of 12)]
[frame 14/91]
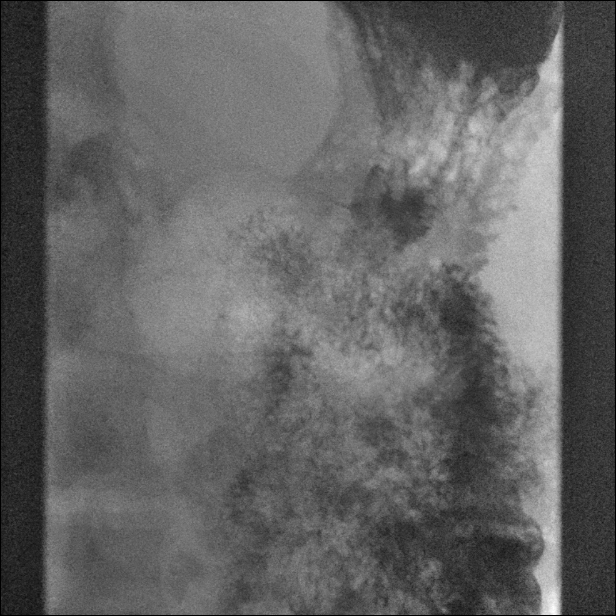

[Series 15: sequence · 1 of 59 frames shown (11 of 12)]
[frame 39/59]
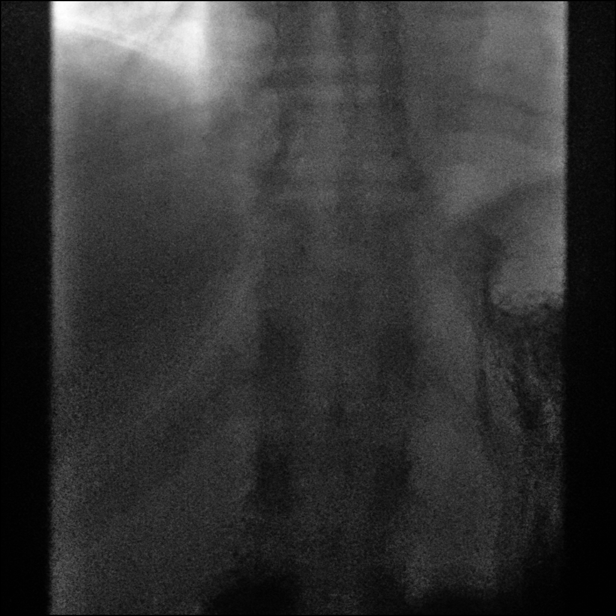

[Series 16: sequence · 1 of 59 frames shown (12 of 12)]
[frame 51/59]
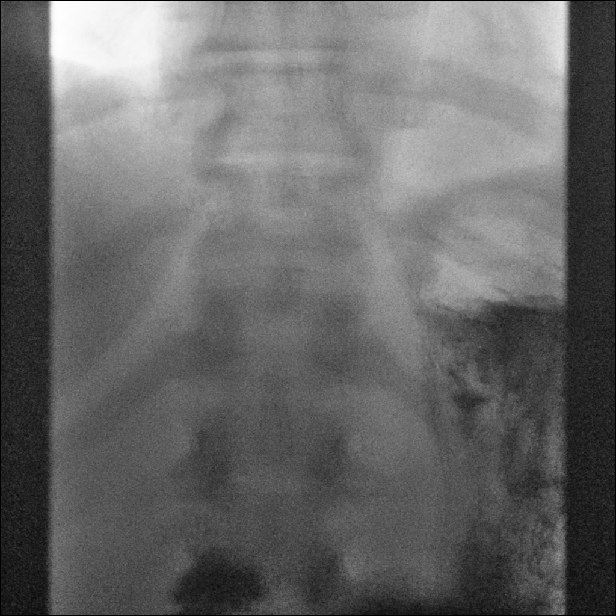

[14 of 24 positions shown; findings below may reference images not displayed]

FINDINGS: Initial KUB demonstrates unremarkable bowel gas pattern.

The pharyngeal phase of swallowing appears normal.

On double contrast evaluation of the esophagus, no definite
ulceration or erosion is observed.

Normal gastric morphology without mass or specific gastric lesion
identified.

An episode of gastroesophageal reflux is noted on image 3 series 7.

Small type 1 hiatal hernia. Primary peristaltic waves in the
esophagus were normal. No esophageal stricture.

No appreciable duodenal abnormality.

A 13 mm barium tablet passed without difficulty into the stomach.
IMPRESSION: 1. Small intermittent type 1 hiatal hernia.
2. Single episode of gastroesophageal reflux noted during the course
of the examination.
3. Otherwise unremarkable exam.

## 2023-03-14 ENCOUNTER — Other Ambulatory Visit: Payer: Self-pay | Admitting: Nurse Practitioner

## 2023-03-17 NOTE — Telephone Encounter (Signed)
Requesting: AMLODIPINE BESYLATE 10 MG TAB  Last Visit: 09/18/2022 Next Visit: Visit date not found Last Refill: 09/18/2022  Please Advise

## 2023-03-17 NOTE — Telephone Encounter (Signed)
Patient needs an appointment

## 2023-04-17 ENCOUNTER — Other Ambulatory Visit: Payer: Self-pay | Admitting: Nurse Practitioner

## 2023-04-17 NOTE — Telephone Encounter (Signed)
Requesting: AMLODIPINE BESYLATE 10 MG TAB  Last Visit: 09/18/2022 Next Visit: Visit date not found Last Refill: 03/17/2023  Please Advise   I called patient and scheduled an office visit for 04/24/23 at 3pm

## 2023-04-24 ENCOUNTER — Ambulatory Visit: Payer: BC Managed Care – PPO | Admitting: Nurse Practitioner

## 2023-04-24 ENCOUNTER — Encounter: Payer: Self-pay | Admitting: Nurse Practitioner

## 2023-04-24 VITALS — BP 130/86 | HR 67 | Temp 97.6°F | Ht 67.0 in | Wt 245.6 lb

## 2023-04-24 DIAGNOSIS — R7303 Prediabetes: Secondary | ICD-10-CM | POA: Insufficient documentation

## 2023-04-24 DIAGNOSIS — I10 Essential (primary) hypertension: Secondary | ICD-10-CM | POA: Diagnosis not present

## 2023-04-24 MED ORDER — AMLODIPINE BESYLATE 10 MG PO TABS: 10.0000 mg | ORAL_TABLET | Freq: Every day | ORAL | 1 refills | Status: DC

## 2023-04-24 NOTE — Assessment & Plan Note (Addendum)
Chronic (ongoing). Patient is not taking the prescribed contrave weight loss medication at this time. She states she would like to work on her weight loss by making diet changes with exercising. She states she is omitting added sugars, pasta, breads and soda during the week from her diet. She is trying to incorporate exercise daily.

## 2023-04-24 NOTE — Progress Notes (Signed)
Established Patient Office Visit  Subjective   Patient ID: Hailey Olson, female    DOB: September 14, 1985  Age: 37 y.o. MRN: [id number]  Chief Complaint  Patient presents with   Medication Management    Rx Refill    HPI Patient presents today to follow-up on hypertension, weight management, and  request for a medication refill. Patient states she is not checking her blood pressure at home however she can tell when her blood pressure is elevated. She states she typically with get a tingling sensation with the headache and mild dizziness. She states the dizziness subsides very quickly. She denies any visual changes when her blood pressure is elevated.  She denies having any lower extremity swelling, chest pain, or shortness of breath.   She states she is trying to be more cognizant in what she eats. She is working on trying to loss weight however has not started the contrave medication that was previously prescribed because the pill is to large. She has changed her diet to eating no sugars, pasta, breads, or sodas during the week.  She also is trying to limit her salt intake to support her blood pressure management and weight. She was exercising at home however she admits she has stopped and plan to start another exercise regimen soon.    Review of Systems  Constitutional:  Negative for weight loss.  Respiratory:  Negative for shortness of breath.   Cardiovascular:  Negative for chest pain and leg swelling.  Neurological:  Positive for dizziness, tingling and headaches.       The tingling occurs with the headache. And the dizziness starts with the headache however quickly subsides.   Psychiatric/Behavioral: Negative.       Objective:     BP 130/86   Pulse 67   Temp 97.6 F (36.4 C)   Ht 5\' 7"  (1.702 m)   Wt 245 lb 9.6 oz (111.4 kg)   SpO2 98%   Breastfeeding No   BMI 38.47 kg/m    Physical Exam Constitutional:      General: She is not in acute distress.    Appearance: Normal  appearance. She is not ill-appearing.  HENT:     Head: Normocephalic and atraumatic.  Cardiovascular:     Rate and Rhythm: Normal rate and regular rhythm.     Pulses: Normal pulses.     Heart sounds: Normal heart sounds. No murmur heard.    No friction rub. No gallop.  Pulmonary:     Effort: Pulmonary effort is normal. No respiratory distress.     Breath sounds: Normal breath sounds. No wheezing.  Chest:     Chest wall: No tenderness.  Musculoskeletal:        General: No swelling.     Right lower leg: No edema.     Left lower leg: No edema.  Neurological:     Mental Status: She is alert and oriented to person, place, and time.  Psychiatric:        Mood and Affect: Mood normal.        Behavior: Behavior normal.        Thought Content: Thought content normal.        Judgment: Judgment normal.     No results found for any visits on 04/24/23.    The ASCVD Risk score (Arnett DK, et al., 2019) failed to calculate for the following reasons:   The 2019 ASCVD risk score is only valid for ages 58 to 83  Assessment & Plan:   Problem List Items Addressed This Visit     Primary hypertension - Primary    Chronic (stable). Patient blood pressure is 130/86 this visit. Patient admits to not checking blood pressure at home. She states she knows when her blood pressure is elevated because she will get a headache. She works where she can use an automatic blood pressure machine. Encouraged her to check her blood pressure at least once a week and share values on Mychart. Informed her this will provide insight on how her blood pressure are running at home and if there is a need to add another blood pressure medication. Encouraged to monitor salt intake and to continue to take Amlodipine 10 mg daily. Will follow up on 3 months.       Relevant Medications   amLODipine (NORVASC) 10 MG tablet   Other Relevant Orders   CBC with Differential/Platelet   Comprehensive metabolic panel   Morbid  obesity (HCC)    Chronic (ongoing). Patient is not taking the prescribed contrave weight loss medication at this time. She states she would like to work on her weight loss by making diet changes with exercising. She states she is omitting added sugars, pasta, breads and soda during the week from her diet. She is trying to incorporate exercise daily.       Prediabetes    Previous Hemoglobin A1c in Jan. 2024 was 5.9. Will recheck A1c today.      Relevant Orders   Hemoglobin A1c    Return in about 3 months (around 07/25/2023) for CPE.    Bishop Dublin, RN

## 2023-04-24 NOTE — Assessment & Plan Note (Addendum)
Chronic (stable). Patient blood pressure is 130/86 this visit. Patient admits to not checking blood pressure at home. She states she knows when her blood pressure is elevated because she will get a headache. She works where she can use an automatic blood pressure machine. Encouraged her to check her blood pressure at least once a week and share values on Mychart. Informed her this will provide insight on how her blood pressure are running at home and if there is a need to add another blood pressure medication. Encouraged to monitor salt intake and to continue to take Amlodipine 10 mg daily. Will follow up on 3 months.

## 2023-04-24 NOTE — Patient Instructions (Addendum)
It was great to see you!  See if your insurance covers either Wegovy or Zepbound injections for weight loss  We are checking your labs today and will let you know the results via mychart/phone.   Start checking your blood pressure while at work, especially if you have a headache. Let me know if your BP is above 140/90.  Let's follow-up in 3 months, sooner if you have concerns.  If a referral was placed today, you will be contacted for an appointment. Please note that routine referrals can sometimes take up to 3-4 weeks to process. Please call our office if you haven't heard anything after this time frame.  Take care,  Rodman Pickle, NP

## 2023-04-24 NOTE — Assessment & Plan Note (Signed)
Previous Hemoglobin A1c in Jan. 2024 was 5.9. Will recheck A1c today.

## 2023-04-25 LAB — COMPREHENSIVE METABOLIC PANEL
AG Ratio: 1.4 (calc) (ref 1.0–2.5)
ALT: 9 U/L (ref 6–29)
AST: 10 U/L (ref 10–30)
Albumin: 4 g/dL (ref 3.6–5.1)
Alkaline phosphatase (APISO): 46 U/L (ref 31–125)
BUN: 13 mg/dL (ref 7–25)
CO2: 22 mmol/L (ref 20–32)
Calcium: 9.3 mg/dL (ref 8.6–10.2)
Chloride: 104 mmol/L (ref 98–110)
Creat: 0.93 mg/dL (ref 0.50–0.97)
Globulin: 2.9 g/dL (ref 1.9–3.7)
Glucose, Bld: 79 mg/dL (ref 65–99)
Potassium: 3.9 mmol/L (ref 3.5–5.3)
Sodium: 138 mmol/L (ref 135–146)
Total Bilirubin: 0.2 mg/dL (ref 0.2–1.2)
Total Protein: 6.9 g/dL (ref 6.1–8.1)

## 2023-04-25 LAB — CBC WITH DIFFERENTIAL/PLATELET
Absolute Monocytes: 460 {cells}/uL (ref 200–950)
Basophils Absolute: 29 {cells}/uL (ref 0–200)
Basophils Relative: 0.4 %
Eosinophils Absolute: 139 {cells}/uL (ref 15–500)
Eosinophils Relative: 1.9 %
HCT: 36.2 % (ref 35.0–45.0)
Hemoglobin: 11.6 g/dL — ABNORMAL LOW (ref 11.7–15.5)
Lymphs Abs: 2212 {cells}/uL (ref 850–3900)
MCH: 27.6 pg (ref 27.0–33.0)
MCHC: 32 g/dL (ref 32.0–36.0)
MCV: 86 fL (ref 80.0–100.0)
MPV: 10.5 fL (ref 7.5–12.5)
Monocytes Relative: 6.3 %
Neutro Abs: 4460 {cells}/uL (ref 1500–7800)
Neutrophils Relative %: 61.1 %
Platelets: 351 10*3/uL (ref 140–400)
RBC: 4.21 10*6/uL (ref 3.80–5.10)
RDW: 13.8 % (ref 11.0–15.0)
Total Lymphocyte: 30.3 %
WBC: 7.3 10*3/uL (ref 3.8–10.8)

## 2023-04-25 LAB — HEMOGLOBIN A1C
Hgb A1c MFr Bld: 5.6 %{Hb} (ref <5.7)
Mean Plasma Glucose: 114 mg/dL
eAG (mmol/L): 6.3 mmol/L

## 2023-05-26 ENCOUNTER — Encounter: Payer: Self-pay | Admitting: Nurse Practitioner

## 2023-07-21 NOTE — Progress Notes (Unsigned)
There were no vitals taken for this visit.   Subjective:    Patient ID: Hailey Olson, female    DOB: 1986-06-16, 37 y.o.   MRN: [id number]  CC: No chief complaint on file.   HPI: Hailey Olson is a 37 y.o. female presenting on 07/22/2023 for comprehensive medical examination. Current medical complaints include:{Blank single:19197::"none","***"}  She currently lives with: Menopausal Symptoms: {Blank single:19197::"yes","no"}  Depression and Anxiety Screen done today and results listed below:     04/24/2023    4:27 PM 03/10/2022    1:03 PM 10/11/2021    2:49 PM  Depression screen PHQ 2/9  Decreased Interest 0 0 0  Down, Depressed, Hopeless 0 0 0  PHQ - 2 Score 0 0 0  Altered sleeping 0 0 0  Tired, decreased energy 0 0 0  Change in appetite 0 0 0  Feeling bad or failure about yourself  0 0 0  Trouble concentrating 0 0 0  Moving slowly or fidgety/restless 0 0 0  Suicidal thoughts 0 0 0  PHQ-9 Score 0 0 0  Difficult doing work/chores Not difficult at all  Not difficult at all      04/24/2023    4:28 PM 03/10/2022    1:03 PM 10/11/2021    2:49 PM  GAD 7 : Generalized Anxiety Score  Nervous, Anxious, on Edge 0 2 1  Control/stop worrying 0 1 0  Worry too much - different things 0 0 2  Trouble relaxing 0 0 0  Restless 0 0 0  Easily annoyed or irritable 0 0 1  Afraid - awful might happen 0 2 2  Total GAD 7 Score 0 5 6  Anxiety Difficulty Not difficult at all  Not difficult at all    The patient {has/does not have:19849} a history of falls. I {did/did not:19850} complete a risk assessment for falls. A plan of care for falls {was/was not:19852} documented.   Past Medical History:  Past Medical History:  Diagnosis Date   Anxiety    Hypertension     Surgical History:  Past Surgical History:  Procedure Laterality Date   DILATION AND CURETTAGE OF UTERUS     for 2nd trimester loss.   parturition  2014 and 2015    Medications:  Current Outpatient Medications on File  Prior to Visit  Medication Sig   amLODipine (NORVASC) 10 MG tablet Take 1 tablet (10 mg total) by mouth daily.   Naltrexone-buPROPion HCl ER (CONTRAVE) 8-90 MG TB12 Start 1 tablet every morning for 7 days, then 1 tablet twice daily (Patient not taking: Reported on 04/24/2023)   propranolol (INDERAL) 20 MG tablet TAKE 1 TABLET BY MOUTH THREE TIMES A DAY AS NEEDED   No current facility-administered medications on file prior to visit.    Allergies:  No Known Allergies  Social History:  Social History   Socioeconomic History   Marital status: Single    Spouse name: Not on file   Number of children: Not on file   Years of education: Not on file   Highest education level: Not on file  Occupational History   Not on file  Tobacco Use   Smoking status: Former    Current packs/day: 0.25    Types: Cigarettes   Smokeless tobacco: Never  Vaping Use   Vaping status: Every Day   Substances: Nicotine  Substance and Sexual Activity   Alcohol use: No   Drug use: No   Sexual activity: Yes    Comment: last  intercourse yesterday  Other Topics Concern   Not on file  Social History Narrative   Not on file   Social Determinants of Health   Financial Resource Strain: Not on file  Food Insecurity: Not on file  Transportation Needs: Not on file  Physical Activity: Not on file  Stress: Not on file  Social Connections: Not on file  Intimate Partner Violence: Not on file   Social History   Tobacco Use  Smoking Status Former   Current packs/day: 0.25   Types: Cigarettes  Smokeless Tobacco Never   Social History   Substance and Sexual Activity  Alcohol Use No    Family History:  Family History  Problem Relation Age of Onset   Hypertension Mother    Diabetes Mother    Hyperlipidemia Father    Heart attack Paternal Grandmother    Stroke Paternal Grandmother    Heart attack Other 33    Past medical history, surgical history, medications, allergies, family history and social  history reviewed with patient today and changes made to appropriate areas of the chart.   ROS All other ROS negative except what is listed above and in the HPI.      Objective:    There were no vitals taken for this visit.  Wt Readings from Last 3 Encounters:  04/24/23 245 lb 9.6 oz (111.4 kg)  09/18/22 248 lb 6.4 oz (112.7 kg)  05/02/22 250 lb 12.8 oz (113.8 kg)    Physical Exam  Results for orders placed or performed in visit on 04/24/23  CBC with Differential/Platelet  Result Value Ref Range   WBC 7.3 3.8 - 10.8 Thousand/uL   RBC 4.21 3.80 - 5.10 Million/uL   Hemoglobin 11.6 (L) 11.7 - 15.5 g/dL   HCT 78.4 69.6 - 29.5 %   MCV 86.0 80.0 - 100.0 fL   MCH 27.6 27.0 - 33.0 pg   MCHC 32.0 32.0 - 36.0 g/dL   RDW 28.4 13.2 - 44.0 %   Platelets 351 140 - 400 Thousand/uL   MPV 10.5 7.5 - 12.5 fL   Neutro Abs 4,460 1,500 - 7,800 cells/uL   Lymphs Abs 2,212 850 - 3,900 cells/uL   Absolute Monocytes 460 200 - 950 cells/uL   Eosinophils Absolute 139 15 - 500 cells/uL   Basophils Absolute 29 0 - 200 cells/uL   Neutrophils Relative % 61.1 %   Total Lymphocyte 30.3 %   Monocytes Relative 6.3 %   Eosinophils Relative 1.9 %   Basophils Relative 0.4 %  Comprehensive metabolic panel  Result Value Ref Range   Glucose, Bld 79 65 - 99 mg/dL   BUN 13 7 - 25 mg/dL   Creat 1.02 7.25 - 3.66 mg/dL   BUN/Creatinine Ratio SEE NOTE: 6 - 22 (calc)   Sodium 138 135 - 146 mmol/L   Potassium 3.9 3.5 - 5.3 mmol/L   Chloride 104 98 - 110 mmol/L   CO2 22 20 - 32 mmol/L   Calcium 9.3 8.6 - 10.2 mg/dL   Total Protein 6.9 6.1 - 8.1 g/dL   Albumin 4.0 3.6 - 5.1 g/dL   Globulin 2.9 1.9 - 3.7 g/dL (calc)   AG Ratio 1.4 1.0 - 2.5 (calc)   Total Bilirubin 0.2 0.2 - 1.2 mg/dL   Alkaline phosphatase (APISO) 46 31 - 125 U/L   AST 10 10 - 30 U/L   ALT 9 6 - 29 U/L  Hemoglobin A1c  Result Value Ref Range   Hgb A1c MFr Bld  5.6 <5.7 % of total Hgb   Mean Plasma Glucose 114 mg/dL   eAG (mmol/L) 6.3 mmol/L       Assessment & Plan:   Problem List Items Addressed This Visit       Cardiovascular and Mediastinum   Primary hypertension     Endocrine   Thyroid nodule     Other   Mixed hyperlipidemia   Morbid obesity (HCC)   Prediabetes   Other Visit Diagnoses     Routine general medical examination at a health care facility    -  Primary   Encounter for hepatitis C screening test for low risk patient            Follow up plan: No follow-ups on file.   LABORATORY TESTING:  - Pap smear: up to date  IMMUNIZATIONS:   - Tdap: Tetanus vaccination status reviewed: last tetanus booster within 10 years. - Influenza:  Declined - Pneumovax: Not applicable - Prevnar: Not applicable - HPV: Not applicable - Shingrix vaccine: Not applicable  SCREENING: -Mammogram: Not applicable  - Colonoscopy: Not applicable  - Bone Density: Not applicable   PATIENT COUNSELING:   Advised to take 1 mg of folate supplement per day if capable of pregnancy.   Sexuality: Discussed sexually transmitted diseases, partner selection, use of condoms, avoidance of unintended pregnancy  and contraceptive alternatives.   Advised to avoid cigarette smoking.  I discussed with the patient that most people either abstain from alcohol or drink within safe limits (<=14/week and <=4 drinks/occasion for males, <=7/weeks and <= 3 drinks/occasion for females) and that the risk for alcohol disorders and other health effects rises proportionally with the number of drinks per week and how often a drinker exceeds daily limits.  Discussed cessation/primary prevention of drug use and availability of treatment for abuse.   Diet: Encouraged to adjust caloric intake to maintain  or achieve ideal body weight, to reduce intake of dietary saturated fat and total fat, to limit sodium intake by avoiding high sodium foods and not adding table salt, and to maintain adequate dietary potassium and calcium preferably from fresh fruits,  vegetables, and low-fat dairy products.    stressed the importance of regular exercise  Injury prevention: Discussed safety belts, safety helmets, smoke detector, smoking near bedding or upholstery.   Dental health: Discussed importance of regular tooth brushing, flossing, and dental visits.    NEXT PREVENTATIVE PHYSICAL DUE IN 1 YEAR. No follow-ups on file.  Annika Selke A Naven Giambalvo

## 2023-07-22 ENCOUNTER — Telehealth: Payer: Self-pay

## 2023-07-22 ENCOUNTER — Encounter: Payer: Self-pay | Admitting: Nurse Practitioner

## 2023-07-22 ENCOUNTER — Ambulatory Visit (INDEPENDENT_AMBULATORY_CARE_PROVIDER_SITE_OTHER): Payer: BC Managed Care – PPO | Admitting: Nurse Practitioner

## 2023-07-22 VITALS — BP 118/80 | HR 68 | Temp 97.8°F | Ht 67.0 in | Wt 237.8 lb

## 2023-07-22 DIAGNOSIS — E782 Mixed hyperlipidemia: Secondary | ICD-10-CM

## 2023-07-22 DIAGNOSIS — Z0000 Encounter for general adult medical examination without abnormal findings: Secondary | ICD-10-CM | POA: Diagnosis not present

## 2023-07-22 DIAGNOSIS — I10 Essential (primary) hypertension: Secondary | ICD-10-CM | POA: Diagnosis not present

## 2023-07-22 DIAGNOSIS — E041 Nontoxic single thyroid nodule: Secondary | ICD-10-CM

## 2023-07-22 DIAGNOSIS — R7303 Prediabetes: Secondary | ICD-10-CM | POA: Diagnosis not present

## 2023-07-22 DIAGNOSIS — M5431 Sciatica, right side: Secondary | ICD-10-CM | POA: Insufficient documentation

## 2023-07-22 DIAGNOSIS — Z1159 Encounter for screening for other viral diseases: Secondary | ICD-10-CM | POA: Diagnosis not present

## 2023-07-22 LAB — COMPREHENSIVE METABOLIC PANEL
ALT: 9 U/L (ref 0–35)
AST: 10 U/L (ref 0–37)
Albumin: 3.9 g/dL (ref 3.5–5.2)
Alkaline Phosphatase: 46 U/L (ref 39–117)
BUN: 10 mg/dL (ref 6–23)
CO2: 27 meq/L (ref 19–32)
Calcium: 9.1 mg/dL (ref 8.4–10.5)
Chloride: 106 meq/L (ref 96–112)
Creatinine, Ser: 0.95 mg/dL (ref 0.40–1.20)
GFR: 75.53 mL/min (ref >60.00)
Glucose, Bld: 89 mg/dL (ref 70–99)
Potassium: 4 meq/L (ref 3.5–5.1)
Sodium: 139 meq/L (ref 135–145)
Total Bilirubin: 0.5 mg/dL (ref 0.2–1.2)
Total Protein: 6.9 g/dL (ref 6.0–8.3)

## 2023-07-22 LAB — HEMOGLOBIN A1C: Hgb A1c MFr Bld: 5.6 % (ref 4.6–6.5)

## 2023-07-22 LAB — LIPID PANEL
Cholesterol: 213 mg/dL — ABNORMAL HIGH (ref 0–200)
HDL: 42.7 mg/dL (ref >39.00)
LDL Cholesterol: 148 mg/dL — ABNORMAL HIGH (ref 0–99)
NonHDL: 169.93
Total CHOL/HDL Ratio: 5
Triglycerides: 110 mg/dL (ref 0.0–149.0)
VLDL: 22 mg/dL (ref 0.0–40.0)

## 2023-07-22 LAB — CBC WITH DIFFERENTIAL/PLATELET
Basophils Absolute: 0 10*3/uL (ref 0.0–0.1)
Basophils Relative: 0.5 % (ref 0.0–3.0)
Eosinophils Absolute: 0.1 10*3/uL (ref 0.0–0.7)
Eosinophils Relative: 1.3 % (ref 0.0–5.0)
HCT: 37.8 % (ref 36.0–46.0)
Hemoglobin: 12.3 g/dL (ref 12.0–15.0)
Lymphocytes Relative: 24.1 % (ref 12.0–46.0)
Lymphs Abs: 1.6 10*3/uL (ref 0.7–4.0)
MCHC: 32.6 g/dL (ref 30.0–36.0)
MCV: 86 fL (ref 78.0–100.0)
Monocytes Absolute: 0.4 10*3/uL (ref 0.1–1.0)
Monocytes Relative: 6.4 % (ref 3.0–12.0)
Neutro Abs: 4.4 10*3/uL (ref 1.4–7.7)
Neutrophils Relative %: 67.7 % (ref 43.0–77.0)
Platelets: 339 10*3/uL (ref 150.0–400.0)
RBC: 4.39 Mil/uL (ref 3.87–5.11)
RDW: 14.6 % (ref 11.5–15.5)
WBC: 6.5 10*3/uL (ref 4.0–10.5)

## 2023-07-22 LAB — TSH: TSH: 0.83 u[IU]/mL (ref 0.35–5.50)

## 2023-07-22 MED ORDER — ZEPBOUND 2.5 MG/0.5ML ~~LOC~~ SOAJ: 2.5000 mg | SUBCUTANEOUS | 0 refills | Status: DC

## 2023-07-22 NOTE — Assessment & Plan Note (Signed)
Chronic, stable. Check A1c today and treat based on results.

## 2023-07-22 NOTE — Assessment & Plan Note (Signed)
Will check lipid panel today.  Encourage weight loss and increased exercise.

## 2023-07-22 NOTE — Assessment & Plan Note (Signed)
Health maintenance reviewed and updated. Discussed nutrition, exercise. Check CMP, CBC today. Follow-up 1 year.   

## 2023-07-22 NOTE — Assessment & Plan Note (Signed)
No red flags on exam. Start sciatica stretches daily. Continue aleve as needed. Follow-up if not improving.

## 2023-07-22 NOTE — Assessment & Plan Note (Signed)
BMI is 37.2. She has been trying to walk more and has lost 8 pounds since her last visit. She is interested in medication to help with weight loss. Will start zepbound 2.5mg  injection weekly in addition to lifestyle modifications. Discussed possible side effects. Follow-up in 6 weeks.

## 2023-07-22 NOTE — Patient Instructions (Addendum)
It was great to see you!  We are checking your labs today and will let you know the results via mychart/phone.   Start zepbound injection once a week. Make sure you are drinking plenty of water. Keep up with the changes you are making now.   Start the sciatica stretches daily. You can use heat/ice. Let me know if it's getting worse.   Let's follow-up in 2 months, sooner if you have concerns.  If a referral was placed today, you will be contacted for an appointment. Please note that routine referrals can sometimes take up to 3-4 weeks to process. Please call our office if you haven't heard anything after this time frame.  Take care,  Rodman Pickle, NP

## 2023-07-22 NOTE — Telephone Encounter (Signed)
A user error has taken place: encounter opened in error, closed for administrative reasons.

## 2023-07-22 NOTE — Assessment & Plan Note (Signed)
Chronic, stable.  Continue amlodipine 10 mg daily.  Check CMP, CBC, lipid panel today.

## 2023-07-22 NOTE — Assessment & Plan Note (Signed)
Thyroid nodule noted on imaging 2 years ago.  Will check TSH today.

## 2023-07-24 LAB — HEPATITIS C ANTIBODY: Hepatitis C Ab: NONREACTIVE

## 2023-09-02 ENCOUNTER — Ambulatory Visit: Payer: BC Managed Care – PPO | Admitting: Nurse Practitioner

## 2023-11-19 ENCOUNTER — Other Ambulatory Visit: Payer: Self-pay | Admitting: Nurse Practitioner

## 2023-11-19 NOTE — Telephone Encounter (Signed)
 Requesting: AMLODIPINE BESYLATE 10 MG TAB  Last Visit: 07/22/2023 Next Visit: 07/25/2024 Last Refill: 04/24/2023  Please Advise

## 2024-04-23 ENCOUNTER — Encounter (HOSPITAL_COMMUNITY): Payer: Self-pay | Admitting: Emergency Medicine

## 2024-04-23 ENCOUNTER — Emergency Department (HOSPITAL_COMMUNITY)

## 2024-04-23 ENCOUNTER — Other Ambulatory Visit: Payer: Self-pay

## 2024-04-23 ENCOUNTER — Emergency Department (HOSPITAL_COMMUNITY)
Admission: EM | Admit: 2024-04-23 | Discharge: 2024-04-24 | Disposition: A | Discharge status: Home / Self Care | Source: Ambulatory Visit | Attending: Emergency Medicine | Admitting: Emergency Medicine

## 2024-04-23 DIAGNOSIS — R509 Fever, unspecified: Secondary | ICD-10-CM | POA: Insufficient documentation

## 2024-04-23 DIAGNOSIS — R112 Nausea with vomiting, unspecified: Secondary | ICD-10-CM | POA: Insufficient documentation

## 2024-04-23 DIAGNOSIS — R059 Cough, unspecified: Secondary | ICD-10-CM | POA: Diagnosis not present

## 2024-04-23 DIAGNOSIS — I10 Essential (primary) hypertension: Secondary | ICD-10-CM | POA: Diagnosis not present

## 2024-04-23 DIAGNOSIS — Z79899 Other long term (current) drug therapy: Secondary | ICD-10-CM | POA: Diagnosis not present

## 2024-04-23 DIAGNOSIS — H6123 Impacted cerumen, bilateral: Secondary | ICD-10-CM | POA: Insufficient documentation

## 2024-04-23 DIAGNOSIS — Z20822 Contact with and (suspected) exposure to covid-19: Secondary | ICD-10-CM | POA: Diagnosis not present

## 2024-04-23 DIAGNOSIS — R519 Headache, unspecified: Secondary | ICD-10-CM | POA: Insufficient documentation

## 2024-04-23 LAB — CBC WITH DIFFERENTIAL/PLATELET
Abs Immature Granulocytes: 0.05 K/uL (ref 0.00–0.07)
Basophils Absolute: 0 K/uL (ref 0.0–0.1)
Basophils Relative: 0 %
Eosinophils Absolute: 0 K/uL (ref 0.0–0.5)
Eosinophils Relative: 0 %
HCT: 38.8 % (ref 36.0–46.0)
Hemoglobin: 12.3 g/dL (ref 12.0–15.0)
Immature Granulocytes: 1 %
Lymphocytes Relative: 10 %
Lymphs Abs: 1.1 K/uL (ref 0.7–4.0)
MCH: 27.4 pg (ref 26.0–34.0)
MCHC: 31.7 g/dL (ref 30.0–36.0)
MCV: 86.4 fL (ref 80.0–100.0)
Monocytes Absolute: 0.6 K/uL (ref 0.1–1.0)
Monocytes Relative: 6 %
Neutro Abs: 8.7 K/uL — ABNORMAL HIGH (ref 1.7–7.7)
Neutrophils Relative %: 83 %
Platelets: 332 K/uL (ref 150–400)
RBC: 4.49 MIL/uL (ref 3.87–5.11)
RDW: 13.7 % (ref 11.5–15.5)
WBC: 10.5 K/uL (ref 4.0–10.5)
nRBC: 0 % (ref 0.0–0.2)

## 2024-04-23 LAB — HCG, SERUM, QUALITATIVE: Preg, Serum: NEGATIVE

## 2024-04-23 LAB — RESP PANEL BY RT-PCR (RSV, FLU A&B, COVID)  RVPGX2
Influenza A by PCR: NEGATIVE
Influenza B by PCR: NEGATIVE
Resp Syncytial Virus by PCR: NEGATIVE
SARS Coronavirus 2 by RT PCR: NEGATIVE

## 2024-04-23 LAB — COMPREHENSIVE METABOLIC PANEL WITH GFR
ALT: 6 U/L (ref 0–44)
AST: <10 U/L — ABNORMAL LOW (ref 15–41)
Albumin: 4.1 g/dL (ref 3.5–5.0)
Alkaline Phosphatase: 52 U/L (ref 38–126)
Anion gap: 15 (ref 5–15)
BUN: 12 mg/dL (ref 6–20)
CO2: 19 mmol/L — ABNORMAL LOW (ref 22–32)
Calcium: 9.5 mg/dL (ref 8.9–10.3)
Chloride: 101 mmol/L (ref 98–111)
Creatinine, Ser: 0.78 mg/dL (ref 0.44–1.00)
GFR, Estimated: >60 mL/min (ref >60)
Glucose, Bld: 119 mg/dL — ABNORMAL HIGH (ref 70–99)
Potassium: 3.6 mmol/L (ref 3.5–5.1)
Sodium: 135 mmol/L (ref 135–145)
Total Bilirubin: 0.5 mg/dL (ref 0.0–1.2)
Total Protein: 7.5 g/dL (ref 6.5–8.1)

## 2024-04-23 LAB — LIPASE, BLOOD: Lipase: 14 U/L (ref 11–51)

## 2024-04-23 MED ORDER — KETOROLAC TROMETHAMINE 15 MG/ML IJ SOLN
15.0000 mg | Freq: Once | INTRAMUSCULAR | Status: AC
  Administered 2024-04-24: 15 mg via INTRAVENOUS
  Filled 2024-04-23: qty 1

## 2024-04-23 MED ORDER — METOCLOPRAMIDE HCL 10 MG PO TABS: 10.0000 mg | ORAL_TABLET | Freq: Four times a day (QID) | ORAL | 0 refills | Status: AC

## 2024-04-23 MED ORDER — DEXAMETHASONE SODIUM PHOSPHATE 10 MG/ML IJ SOLN
10.0000 mg | Freq: Once | INTRAMUSCULAR | Status: AC
  Administered 2024-04-24: 10 mg via INTRAVENOUS
  Filled 2024-04-23: qty 1

## 2024-04-23 MED ORDER — METOCLOPRAMIDE HCL 5 MG/ML IJ SOLN
10.0000 mg | Freq: Once | INTRAMUSCULAR | Status: AC
  Administered 2024-04-23: 10 mg via INTRAVENOUS
  Filled 2024-04-23: qty 2

## 2024-04-23 MED ORDER — LACTATED RINGERS IV BOLUS
1000.0000 mL | Freq: Once | INTRAVENOUS | Status: AC
  Administered 2024-04-23: 1000 mL via INTRAVENOUS

## 2024-04-23 MED ORDER — DIPHENHYDRAMINE HCL 50 MG/ML IJ SOLN
12.5000 mg | Freq: Once | INTRAMUSCULAR | Status: AC
  Administered 2024-04-23: 12.5 mg via INTRAVENOUS
  Filled 2024-04-23: qty 1

## 2024-04-23 MED ORDER — ACETAMINOPHEN 325 MG PO TABS
650.0000 mg | ORAL_TABLET | Freq: Once | ORAL | Status: AC | PRN
  Administered 2024-04-23: 650 mg via ORAL
  Filled 2024-04-23: qty 2

## 2024-04-23 NOTE — ED Triage Notes (Addendum)
 Patient presents with a headache, body aches, nausea/ vomiting and chills. She last vomited around 0300 today. No PO intake since yesterday. Symptoms started this past Thursday. Urgent care sent her here.

## 2024-04-23 NOTE — ED Provider Notes (Signed)
 [place] EMERGENCY DEPARTMENT AT [facility] Provider Note   CSN: [id number] Arrival date & time: 04/23/24  1606     Patient presents with: Headache, Emesis, Nausea, and Generalized Body Aches   Hailey Olson is a 38 y.o. female.   Headache Associated symptoms: vomiting   Emesis Associated symptoms: headaches   Patient is a 38 year old female to the ED today for concerns for fever, headache noted to be pulsatile, accompanied with vomiting.  Notes that she has also had light sensitivity, body aches and chills with no other symptoms at this time.  Denies neck stiffness, diplopia, vertigo, blurry vision, hearing loss, otalgia, dysphagia, odynophagia, congestion, cough, chest pain, shortness of breath, abdominal pain, diarrhea, dysuria, hematochezia, melena, lower leg swelling, rashes.     Prior to Admission medications   Medication Sig Start Date End Date Taking? Authorizing Provider  metoCLOPramide  (REGLAN ) 10 MG tablet Take 1 tablet (10 mg total) by mouth every 6 (six) hours. 04/23/24  Yes Melisssa Donner S, PA-C  amLODipine  (NORVASC ) 10 MG tablet TAKE 1 TABLET BY MOUTH EVERY DAY 11/19/23   McElwee, Tinnie LABOR, NP  Naltrexone-buPROPion HCl ER (CONTRAVE ) 8-90 MG TB12 Start 1 tablet every morning for 7 days, then 1 tablet twice daily Patient not taking: Reported on 04/24/2023 09/18/22   McElwee, Lauren A, NP  propranolol  (INDERAL ) 20 MG tablet TAKE 1 TABLET BY MOUTH THREE TIMES A DAY AS NEEDED 06/12/22   McElwee, Lauren A, NP  tirzepatide  (ZEPBOUND ) 2.5 MG/0.5ML Pen Inject 2.5 mg into the skin once a week. 07/22/23   McElwee, Tinnie LABOR, NP    Allergies: Patient has no known allergies.    Review of Systems  Gastrointestinal:  Positive for vomiting.  Neurological:  Positive for headaches.  All other systems reviewed and are negative.   Updated Vital Signs BP 132/78 (BP Location: Left Arm)   Pulse 79   Temp 99.1 F (37.3 C) (Oral)   Resp 18   SpO2 100%   Physical  Exam Vitals and nursing note reviewed.  Constitutional:      General: She is not in acute distress.    Appearance: Normal appearance. She is not ill-appearing or diaphoretic.  HENT:     Head: Normocephalic and atraumatic.     Right Ear: There is impacted cerumen.     Left Ear: There is impacted cerumen.     Mouth/Throat:     Mouth: Mucous membranes are moist.     Pharynx: Oropharynx is clear. No oropharyngeal exudate or posterior oropharyngeal erythema.  Eyes:     General: No scleral icterus.       Right eye: No discharge.        Left eye: No discharge.     Extraocular Movements: Extraocular movements intact.     Conjunctiva/sclera: Conjunctivae normal.     Pupils: Pupils are equal, round, and reactive to light.  Neck:     Comments: No meningeal signs. Cardiovascular:     Rate and Rhythm: Normal rate and regular rhythm.     Pulses: Normal pulses.     Heart sounds: Normal heart sounds. No murmur heard.    No friction rub. No gallop.  Pulmonary:     Effort: Pulmonary effort is normal. No respiratory distress.     Breath sounds: No stridor. No wheezing, rhonchi or rales.  Chest:     Chest wall: No tenderness.  Abdominal:     General: Abdomen is flat. There is no distension.     Palpations:  Abdomen is soft.     Tenderness: There is no abdominal tenderness. There is no right CVA tenderness, left CVA tenderness, guarding or rebound.  Musculoskeletal:        General: No swelling, deformity or signs of injury.     Cervical back: Normal range of motion and neck supple. No rigidity or tenderness.     Right lower leg: No edema.     Left lower leg: No edema.  Skin:    General: Skin is warm and dry.     Findings: No bruising, erythema or lesion.  Neurological:     General: No focal deficit present.     Mental Status: She is alert and oriented to person, place, and time. Mental status is at baseline.     Cranial Nerves: No cranial nerve deficit.     Sensory: No sensory deficit.      Motor: No weakness.     Coordination: Coordination normal.  Psychiatric:        Mood and Affect: Mood normal.     (all labs ordered are listed, but only abnormal results are displayed) Labs Reviewed  CBC WITH DIFFERENTIAL/PLATELET - Abnormal; Notable for the following components:      Result Value   Neutro Abs 8.7 (*)    All other components within normal limits  COMPREHENSIVE METABOLIC PANEL WITH GFR - Abnormal; Notable for the following components:   CO2 19 (*)    Glucose, Bld 119 (*)    AST <10 (*)    All other components within normal limits  RESP PANEL BY RT-PCR (RSV, FLU A&B, COVID)  RVPGX2  LIPASE, BLOOD  HCG, SERUM, QUALITATIVE  URINALYSIS, ROUTINE W REFLEX MICROSCOPIC    EKG: None  Radiology: DG Chest 2 View Result Date: 04/23/2024 CLINICAL DATA:  Fever of unknown origin EXAM: CHEST - 2 VIEW COMPARISON:  None Available. FINDINGS: The heart size and mediastinal contours are within normal limits. Both lungs are clear. The visualized skeletal structures are unremarkable. IMPRESSION: No active cardiopulmonary disease. Electronically Signed   By: Greig Pique M.D.   On: 04/23/2024 20:04     Procedures   Medications Ordered in the ED  ketorolac  (TORADOL ) 15 MG/ML injection 15 mg (has no administration in time range)  dexamethasone  (DECADRON ) injection 10 mg (has no administration in time range)  acetaminophen  (TYLENOL ) tablet 650 mg (650 mg Oral Given 04/23/24 1706)  lactated ringers  bolus 1,000 mL (1,000 mLs Intravenous New Bag/Given 04/23/24 2202)  metoCLOPramide  (REGLAN ) injection 10 mg (10 mg Intravenous Given 04/23/24 2208)  diphenhydrAMINE  (BENADRYL ) injection 12.5 mg (12.5 mg Intravenous Given 04/23/24 2208)                                Medical Decision Making Amount and/or Complexity of Data Reviewed Labs: ordered. Radiology: ordered.  Risk OTC drugs. Prescription drug management.   This patient is a 38 year old female who presents to the ED for  concern of headache, nausea, vomiting, fever x 3 days.  Noted to have had 1 episode of vomiting today with persistent headache and fever starting today.  Noted to have a temp of 103F.  On physical exam, patient is in no acute distress, afebrile, alert and orient x 4, speaking in full sentences, nontachypneic, nontachycardic.  No meningeal signs, supple neck, LCTAB, RRR, no no murmur, no abdominal tenderness to palpation, no CVA tenderness, no lower leg edema, no rashes noted, PERRL, oropharynx clear.  Unremarkable physical exam otherwise.  With patient's headache being reported as pulsatile, suspecting likely migraine headache with her say that she has not eaten or drinking much recently.  Provide migraine cocktail as well as broad workup to evaluate for possible abnormalities causing patient's fever of unknown cause with patient having no other symptoms outside of headache, vomiting, fever.  Lab work is unremarkable at this time, with urine pending.  Provided Reglan  and fluids first and patient symptoms had resolved on reevaluation.  Provided Toradol  and Decadron  additionally.  Awaiting urine, patient care transferred over to Rea Bullion, PA-C  Differential diagnoses prior to evaluation: The emergent differential diagnosis includes, but is not limited to, tension headache, migraine, polypharmacy, substance abuse, sinusitis, cervicogenic headache, dehydration, cluster headache, trigeminal neuralgia, IIH, PRES syndrome, intracranial bleed, CVA, sepsis. This is not an exhaustive differential.   Past Medical History / Co-morbidities / Social History: HTN, anxiety, migraines  Additional history: Chart reviewed. Pertinent results include:   HTN, anxiety with no other chronic past medical history  Lab Tests/Imaging studies: I personally interpreted labs/imaging and the pertinent results include:   CBC notes borderline normal white count but otherwise unremarkable CMP unremarkable hCG qualitative  negative Lipase unremarkable UA pending Respiratory panel unremarkable Chest x-ray unremarkable .   I agree with the radiologist interpretation.  Medications: I ordered medication including Benadryl , Reglan , LR, Tylenol , dexamethasone , Toradol .  I have reviewed the patients home medicines and have made adjustments as needed.  Critical Interventions:  Social Determinants of Health:  Disposition: 11:58 PM Care of Hailey Olson transferred to [facility] and Dr. Bari at the end of my shift as the patient will require reassessment once labs/imaging have resulted. Patient presentation, ED course, and plan of care discussed with review of all pertinent labs and imaging. Please see his/her note for further details regarding further ED course and disposition. Plan at time of handoff is await urine, discharged home with follow-up with PCP. This may be altered or completely changed at the discretion of the oncoming team pending results of further workup.   Final diagnoses:  Fever, unspecified fever cause  Acute nonintractable headache, unspecified headache type  Nausea and vomiting, unspecified vomiting type    ED Discharge Orders          Ordered    metoCLOPramide  (REGLAN ) 10 MG tablet  Every 6 hours        04/23/24 2358               Beola Terrall RAMAN, PA-C 04/24/24 0000    Lenor Hollering, MD 04/24/24 1459

## 2024-04-23 NOTE — Discharge Instructions (Addendum)
 You were seen today for fever, headache and nausea and vomiting.  Your physical exam and your lab work were reassuring that I have low suspicion for any emergent causes of your symptoms today.  However would recommend you continue to follow-up with your PCP if you persist to have persistent fevers through the weekend.  I have sent you home with some nausea and headache medication as well as recommend that he continue to take Tylenol  and ibuprofen to manage fever.  Return to the ED though if you do any new or worsening symptoms which would include uncontrollable headache, uncontrollable fever, chest pain, shortness of breath, persistent vomiting despite nausea medications, blood in urine or stool

## 2024-04-24 LAB — URINALYSIS, ROUTINE W REFLEX MICROSCOPIC
Bilirubin Urine: NEGATIVE
Glucose, UA: NEGATIVE mg/dL
Hgb urine dipstick: NEGATIVE
Ketones, ur: 5 mg/dL — AB
Leukocytes,Ua: NEGATIVE
Nitrite: NEGATIVE
Protein, ur: NEGATIVE mg/dL
Specific Gravity, Urine: 1.019 (ref 1.005–1.030)
pH: 5 (ref 5.0–8.0)

## 2024-04-24 NOTE — ED Provider Notes (Signed)
  Physical Exam  BP 132/78 (BP Location: Left Arm)   Pulse 79   Temp 99.1 F (37.3 C) (Oral)   Resp 18   SpO2 100%   Physical Exam  Procedures  Procedures  ED Course / MDM    Medical Decision Making Amount and/or Complexity of Data Reviewed Labs: ordered. Radiology: ordered.  Risk OTC drugs. Prescription drug management.   Patient care symptoms shift handoff from previous provider.  See his note for full details.  In short, 38 year old female patient presented to the Emergency Department for fever, headache, vomiting, body aches, chills.  Patient with headache described as pulsatile.  Previous provider felt this was likely a migraine headache.  Patient was feeling better after headache cocktail.  Plan to follow-up on results of urinalysis for final disposition plan.  UA showed no signs of infection.  Patient feeling better.  Patient appears stable for discharge home at this time.  Return precautions provided.  Patient to follow-up with primary care provider.       Logan Ubaldo NOVAK, PA-C 04/24/24 9953    Bari Charmaine FALCON, MD 04/24/24 [phone]

## 2024-04-27 ENCOUNTER — Encounter (HOSPITAL_COMMUNITY): Payer: Self-pay

## 2024-04-27 ENCOUNTER — Other Ambulatory Visit: Payer: Self-pay

## 2024-04-27 ENCOUNTER — Emergency Department (HOSPITAL_COMMUNITY)
Admission: EM | Admit: 2024-04-27 | Discharge: 2024-04-27 | Disposition: A | Discharge status: Home / Self Care | Attending: Emergency Medicine | Admitting: Emergency Medicine

## 2024-04-27 DIAGNOSIS — Z87891 Personal history of nicotine dependence: Secondary | ICD-10-CM | POA: Insufficient documentation

## 2024-04-27 DIAGNOSIS — R519 Headache, unspecified: Secondary | ICD-10-CM | POA: Insufficient documentation

## 2024-04-27 DIAGNOSIS — Z79899 Other long term (current) drug therapy: Secondary | ICD-10-CM | POA: Diagnosis not present

## 2024-04-27 DIAGNOSIS — R1110 Vomiting, unspecified: Secondary | ICD-10-CM | POA: Diagnosis not present

## 2024-04-27 DIAGNOSIS — I10 Essential (primary) hypertension: Secondary | ICD-10-CM | POA: Insufficient documentation

## 2024-04-27 MED ORDER — DEXAMETHASONE 4 MG PO TABS
6.0000 mg | ORAL_TABLET | Freq: Once | ORAL | Status: AC
  Administered 2024-04-27: 6 mg via ORAL
  Filled 2024-04-27: qty 1

## 2024-04-27 MED ORDER — KETOROLAC TROMETHAMINE 60 MG/2ML IM SOLN
30.0000 mg | Freq: Once | INTRAMUSCULAR | Status: AC
  Administered 2024-04-27: 30 mg via INTRAMUSCULAR
  Filled 2024-04-27: qty 2

## 2024-04-27 NOTE — Discharge Instructions (Signed)
 Return to the emergency department if your symptoms worsen.  Continue Reglan  as previously directed, continue ibuprofen/Tylenol  as previously directed.  Follow-up with your primary care provider if your headaches persist despite these measures.

## 2024-04-27 NOTE — ED Provider Notes (Signed)
 [place] EMERGENCY DEPARTMENT AT [facility] Provider Note   CSN: [id number] Arrival date & time: 04/27/24  1018     Patient presents with: Headache   Hailey Olson is a 38 y.o. female.   38 year old female presenting with headache.  Patient was seen in the emergency department 8/30 for similar complaint as well as fever, this is since resolved however patient reports a daily headache that is only relieved for several hours at a time after using Reglan  that was prescribed to her as well as Tylenol /ibuprofen/Goody powder.  Describes headache as tightness, like a headband. Reports 1 episode of vomiting on Saturday, none since.  No diarrhea, no recurrence of fever, no cough/congestion/shortness of breath/chest pain, no vision changes, no dizziness. She took her Reglan  shortly before I entered the room to examine her.    Headache      Prior to Admission medications   Medication Sig Start Date End Date Taking? Authorizing Provider  amLODipine  (NORVASC ) 10 MG tablet TAKE 1 TABLET BY MOUTH EVERY DAY 11/19/23   McElwee, Lauren A, NP  metoCLOPramide  (REGLAN ) 10 MG tablet Take 1 tablet (10 mg total) by mouth every 6 (six) hours. 04/23/24   Bauer, Collin S, PA-C  Naltrexone-buPROPion HCl ER (CONTRAVE ) 8-90 MG TB12 Start 1 tablet every morning for 7 days, then 1 tablet twice daily Patient not taking: Reported on 04/24/2023 09/18/22   McElwee, Lauren A, NP  propranolol  (INDERAL ) 20 MG tablet TAKE 1 TABLET BY MOUTH THREE TIMES A DAY AS NEEDED 06/12/22   McElwee, Lauren A, NP  tirzepatide  (ZEPBOUND ) 2.5 MG/0.5ML Pen Inject 2.5 mg into the skin once a week. 07/22/23   McElwee, Tinnie LABOR, NP    Allergies: Patient has no known allergies.    Review of Systems  Neurological:  Positive for headaches.    Updated Vital Signs  Vitals:   04/27/24 1038 04/27/24 1038  BP:  (!) 140/88  Pulse: (!) 55   Resp:  18  Temp:  98.4 F (36.9 C)  TempSrc:  Oral  SpO2:  100%     Physical  Exam Vitals and nursing note reviewed.  HENT:     Head: Normocephalic.  Eyes:     Extraocular Movements: Extraocular movements intact.     Pupils: Pupils are equal, round, and reactive to light.     Comments: No nystagmus No photophobia  Neck:     Vascular: No carotid bruit.  Cardiovascular:     Rate and Rhythm: Normal rate and regular rhythm.     Heart sounds: Normal heart sounds.  Pulmonary:     Effort: Pulmonary effort is normal.     Breath sounds: Normal breath sounds.  Musculoskeletal:     Cervical back: Normal range of motion and neck supple. No rigidity or tenderness.     Comments: Moves all extremities spontaneously without difficulty 5/5 strength against resistance of bilateral upper and lower extremities  Skin:    General: Skin is warm and dry.  Neurological:     General: No focal deficit present.     Mental Status: She is alert and oriented to person, place, and time.     Sensory: No sensory deficit.     Motor: No weakness.     Comments: Facial expressions are symmetric and intact without evidence of facial droop Normal cerebellar testing without ataxia, including finger-to-nose Grip strength intact and equal bilaterally     (all labs ordered are listed, but only abnormal results are displayed) Labs Reviewed -  No data to display  EKG: None  Radiology: No results found.   Procedures   Medications Ordered in the ED  ketorolac  (TORADOL ) injection 30 mg (30 mg Intramuscular Given 04/27/24 1314)  dexamethasone  (DECADRON ) tablet 6 mg (6 mg Oral Given 04/27/24 1314)                                    Medical Decision Making This patient presents to the ED for concern of headache, this involves an extensive number of treatment options, and is a complaint that carries with it a high risk of complications and morbidity.  The differential diagnosis includes tension headache, migraine headache, cluster headache, dehydration, meningitis, stroke/TIA   Co morbidities  that complicate the patient evaluation  Hypertension, prediabetes   Additional history obtained:  Additional history obtained from record review External records from outside source obtained and reviewed including most recent emergency department note    Problem List / ED Course / Critical interventions / Medication management  I ordered medication including Toradol  and Decadron  for headache  Reevaluation of the patient after these medicines showed that the patient improved I have reviewed the patients home medicines and have made adjustments as needed   Social Determinants of Health:  Former tobacco use   Test / Admission - Considered:  Physical exam is largely unremarkable, normal neurologic exam, no meningeal signs, no red flag headache signs.  Headache does respond to Reglan  that was previously prescribed as well as ibuprofen/Tylenol , but returns.  Based on these reassuring findings as above, I do not feel that any imaging of her brain is warranted at this time.  I suspect that the patient may be getting over a recent viral illness, as when she was seen previously she also had a fever as well, this has not recurred.  I reviewed her lab results in depth with her and her mother that were obtained at her prior visit.  Patient was given Toradol  and Decadron  in the emergency department today, does feel improvement in her symptoms upon reassessment.  She is wondering if her headache may be worsened due to stress at work.  I recommend that she follow-up with her primary care provider if her headaches persist despite these measures, she voiced understanding and is in agreement with this plan.  Strict return precautions discussed, she is appropriate for discharge at this time.    Risk Prescription drug management.        Final diagnoses:  Nonintractable headache, unspecified chronicity pattern, unspecified headache type    ED Discharge Orders     None          Hailey Olson, [place] 04/27/24 1406    Hailey Glendia, MD 05/01/24 2234

## 2024-04-27 NOTE — ED Triage Notes (Signed)
 Pt states she is still having the headaches, sensitive to light. Denies any nausea and vomiting. She was here on the 30th for the same. States the n/v has gotten better but the headache has not gotten any better. Has been taking the tylenol  and ibuprofen without any relief.

## 2024-04-29 ENCOUNTER — Encounter: Payer: Self-pay | Admitting: Nurse Practitioner

## 2024-04-29 ENCOUNTER — Ambulatory Visit: Admitting: Nurse Practitioner

## 2024-04-29 VITALS — BP 122/80 | HR 50 | Temp 97.5°F | Ht 67.0 in | Wt 227.2 lb

## 2024-04-29 DIAGNOSIS — G4452 New daily persistent headache (NDPH): Secondary | ICD-10-CM | POA: Diagnosis not present

## 2024-04-29 DIAGNOSIS — J029 Acute pharyngitis, unspecified: Secondary | ICD-10-CM

## 2024-04-29 DIAGNOSIS — H6123 Impacted cerumen, bilateral: Secondary | ICD-10-CM | POA: Diagnosis not present

## 2024-04-29 DIAGNOSIS — F419 Anxiety disorder, unspecified: Secondary | ICD-10-CM

## 2024-04-29 LAB — POCT RAPID STREP A (OFFICE): Rapid Strep A Screen: NEGATIVE

## 2024-04-29 MED ORDER — PROPRANOLOL HCL 20 MG PO TABS: 20.0000 mg | ORAL_TABLET | Freq: Three times a day (TID) | ORAL | 0 refills | Status: AC | PRN

## 2024-04-29 NOTE — Assessment & Plan Note (Signed)
 She experiences recurrent daily headaches likely due to migraines, rebound headaches from frequent Excedrin and Tylenol  use, or secondary to a recent illness, with noted light sensitivity. Start propranolol  20mg  once daily and continue Excedrin as needed. Consider a short course of oral steroids if headaches persist daily through the weekend.

## 2024-04-29 NOTE — Progress Notes (Signed)
 Established Patient Office Visit  Subjective   Patient ID: Hailey Olson, female    DOB: [birth year]  Age: 38 y.o. MRN: [id number]  Chief Complaint  Patient presents with   Hospitalization Follow-up    [facility] to ED for recurrent headaches on 8/30 and 9/3, concerns with sweating and questions if menopause, discuss stress and FMLA    HPI Discussed the use of AI scribe software for clinical note transcription with the patient, who gave verbal consent to proceed.  History of Present Illness   Hailey Olson is a 38 year old female who presents with persistent headaches and recent illness.  She has experienced persistent headaches since last Thursday, severe enough to require daily medication. Initial symptoms included fatigue, weakness, body aches, chills, and headache, suspected to be flu-related. She took Theraflu and Tylenol  without relief.  On Saturday, she visited urgent care and tested negative for flu and COVID. At the ER, she was found to be dehydrated with a fever of 103F and received IV fluids. Her condition improved slightly on Sunday but worsened by Monday.  On Tuesday, she returned to the ER and received a steroid shot, which provided significant relief. However, she continues to have headaches, especially in the mornings, and takes Excedrin Migraine daily. The headaches are primarily on the right side, with photophobia but no phonophobia.  She denies a sore throat but feels throat discomfort due to frequent pill intake. She experiences random sweating, particularly on her back. She has a history of anxiety, heart palpitations, and stress related to work and home life. She has not been taking propranolol  regularly and is considering taking time off work due to stress and health issues.        04/29/2024   12:13 PM 07/22/2023    9:13 AM 04/24/2023    4:27 PM 03/10/2022    1:03 PM 10/11/2021    2:49 PM  Depression screen PHQ 2/9  Decreased Interest 2 0 0 0 0  Down, Depressed, Hopeless  2 0 0 0 0  PHQ - 2 Score 4 0 0 0 0  Altered sleeping 0 0 0 0 0  Tired, decreased energy 2 0 0 0 0  Change in appetite 2 0 0 0 0  Feeling bad or failure about yourself  1 0 0 0 0  Trouble concentrating 2 0 0 0 0  Moving slowly or fidgety/restless 0 0 0 0 0  Suicidal thoughts 0 0 0 0 0  PHQ-9 Score 11 0 0 0 0  Difficult doing work/chores Somewhat difficult Not difficult at all Not difficult at all  Not difficult at all      04/29/2024   12:14 PM 07/22/2023    9:14 AM 04/24/2023    4:28 PM 03/10/2022    1:03 PM  GAD 7 : Generalized Anxiety Score  Nervous, Anxious, on Edge 1 0 0 2  Control/stop worrying 2 0 0 1  Worry too much - different things 2 0 0 0  Trouble relaxing 0 0 0 0  Restless 1 0 0 0  Easily annoyed or irritable 1 0 0 0  Afraid - awful might happen 1 1 0 2  Total GAD 7 Score 8 1 0 5  Anxiety Difficulty Somewhat difficult Not difficult at all Not difficult at all      ROS See pertinent positives and negatives per HPI.    Objective:     BP 122/80 (BP Location: Right Arm, Patient Position: Sitting, Cuff Size: Large)  Pulse (!) 50   Temp (!) 97.5 F (36.4 C)   Ht 5' 7 (1.702 m)   Wt 227 lb 3.2 oz (103.1 kg)   SpO2 99%   BMI 35.58 kg/m  BP Readings from Last 3 Encounters:  04/29/24 122/80  04/27/24 (!) 140/88  04/24/24 138/83   Wt Readings from Last 3 Encounters:  04/29/24 227 lb 3.2 oz (103.1 kg)  07/22/23 237 lb 12.8 oz (107.9 kg)  04/24/23 245 lb 9.6 oz (111.4 kg)      Physical Exam Vitals and nursing note reviewed.  Constitutional:      General: She is not in acute distress.    Appearance: Normal appearance.  HENT:     Head: Normocephalic.     Right Ear: External ear normal. There is impacted cerumen.     Left Ear: External ear normal. There is impacted cerumen.     Mouth/Throat:     Mouth: Mucous membranes are moist.     Pharynx: Posterior oropharyngeal erythema present. No oropharyngeal exudate.  Eyes:     Conjunctiva/sclera:  Conjunctivae normal.  Cardiovascular:     Rate and Rhythm: Normal rate and regular rhythm.     Pulses: Normal pulses.     Heart sounds: Normal heart sounds.  Pulmonary:     Effort: Pulmonary effort is normal.     Breath sounds: Normal breath sounds.  Musculoskeletal:     Cervical back: Normal range of motion and neck supple. No tenderness.  Lymphadenopathy:     Cervical: No cervical adenopathy.  Skin:    General: Skin is warm.  Neurological:     General: No focal deficit present.     Mental Status: She is alert and oriented to person, place, and time.  Psychiatric:        Mood and Affect: Mood normal.        Behavior: Behavior normal.        Thought Content: Thought content normal.        Judgment: Judgment normal.    The 10-year ASCVD risk score (Arnett DK, et al., 2019) is: 2.2%    Assessment & Plan:   Problem List Items Addressed This Visit       Other   Anxiety   Chronic, not controlled. She reports increased stress and anxiety with a racing heart and crying spells. Propranolol  was previously prescribed but used inconsistently. Start propranolol  20mg  once daily. Will place referral to therapist. Will write her out of work for 2 weeks until 05/16/24.       Relevant Orders   Ambulatory referral to Psychology   New daily persistent headache   She experiences recurrent daily headaches likely due to migraines, rebound headaches from frequent Excedrin and Tylenol  use, or secondary to a recent illness, with noted light sensitivity. Start propranolol  20mg  once daily and continue Excedrin as needed. Consider a short course of oral steroids if headaches persist daily through the weekend.       Relevant Medications   propranolol  (INDERAL ) 20 MG tablet   Other Visit Diagnoses       Sore throat    -  Primary   Strep negative today, covid and flu negative at urgent care. Encourage fluids, rest.   Relevant Orders   POCT rapid strep A (Completed)     Bilateral impacted cerumen        After obtaining verbal consent, bilateral ears irrigated with good results. She tolerated the procedure well.       Return  in about 4 weeks (around 05/27/2024) for Anxiety, headaches.    Tinnie DELENA Harada, NP

## 2024-04-29 NOTE — Patient Instructions (Addendum)
 It was great to see you!  Start taking propranolol  once a day to help with anxiety and headaches  I will write you out of work for anxiety and headaches for 2 weeks   Drink plenty of fluids   Send me a message on Monday or Tuesday if you are still having daily headaches   Let's follow-up in 4-6 weeks, sooner if you have concerns.  If a referral was placed today, you will be contacted for an appointment. Please note that routine referrals can sometimes take up to 3-4 weeks to process. Please call our office if you haven't heard anything after this time frame.  Take care,  Tinnie Harada, NP

## 2024-04-29 NOTE — Assessment & Plan Note (Signed)
 Chronic, not controlled. She reports increased stress and anxiety with a racing heart and crying spells. Propranolol  was previously prescribed but used inconsistently. Start propranolol  20mg  once daily. Will place referral to therapist. Will write her out of work for 2 weeks until 05/16/24.

## 2024-05-09 ENCOUNTER — Encounter: Payer: Self-pay | Admitting: Nurse Practitioner

## 2024-05-09 NOTE — Telephone Encounter (Signed)
 Form placed in front off ice for patient to pick up and a copy made for medical records. Thanks, BMS/CMA

## 2024-05-18 NOTE — Telephone Encounter (Unsigned)
 Copied from CRM #[id number]. Topic: General - Other >> May 18, 2024 11:34 AM Pinkey ORN wrote: Reason for CRM: Short Term Disability >> May 18, 2024 11:38 AM Pinkey ORN wrote: Larraine RN [employer] / [employer] [place]  - [phone] (confidential voicemail) Called on behalf of patient, wanting to discuss the visit notes from patient's previous appointment on 04/29/24, as well as confirm any medications given that day. States she's arranging short term disability for the patient.

## 2024-05-20 NOTE — Telephone Encounter (Signed)
 Adiditonal information faxed to [employer] [place]  at [phone].

## 2024-05-31 ENCOUNTER — Ambulatory Visit: Admitting: Nurse Practitioner

## 2024-05-31 ENCOUNTER — Encounter: Payer: Self-pay | Admitting: Nurse Practitioner

## 2024-05-31 ENCOUNTER — Other Ambulatory Visit (HOSPITAL_COMMUNITY): Payer: Self-pay

## 2024-05-31 ENCOUNTER — Telehealth: Payer: Self-pay

## 2024-05-31 VITALS — BP 122/76 | HR 60 | Temp 97.3°F | Ht 67.0 in | Wt 231.6 lb

## 2024-05-31 DIAGNOSIS — M5442 Lumbago with sciatica, left side: Secondary | ICD-10-CM | POA: Diagnosis not present

## 2024-05-31 DIAGNOSIS — F419 Anxiety disorder, unspecified: Secondary | ICD-10-CM

## 2024-05-31 LAB — POCT URINE PREGNANCY: Preg Test, Ur: NEGATIVE

## 2024-05-31 MED ORDER — NAPROXEN 500 MG PO TABS: 500.0000 mg | ORAL_TABLET | Freq: Two times a day (BID) | ORAL | 0 refills | Status: AC

## 2024-05-31 MED ORDER — LIDOCAINE 5 % EX PTCH: 1.0000 | MEDICATED_PATCH | CUTANEOUS | 0 refills | Status: AC

## 2024-05-31 NOTE — Assessment & Plan Note (Signed)
 Her anxiety symptoms have improved with a recent break from work and propranolol  20mg  TID prn.

## 2024-05-31 NOTE — Telephone Encounter (Signed)
 Pharmacy Patient Advocate Encounter   Received notification from CoverMyMeds that prior authorization for Lidocaine 5% patches  is required/requested.   Insurance verification completed.   The patient is insured through [facility].   Per test claim: PA required; PA submitted to above mentioned insurance via Latent Key/confirmation #/EOC [id number] Status is pending

## 2024-05-31 NOTE — Patient Instructions (Signed)
 It was great to see you!  Start naproxen twice a day with food   Start the attached stretches daily  Use ice or heat as needed.  Start lidocaine patch during the day and take off at night  Let's follow-up at your next visit, sooner if you have concerns.  If a referral was placed today, you will be contacted for an appointment. Please note that routine referrals can sometimes take up to 3-4 weeks to process. Please call our office if you haven't heard anything after this time frame.  Take care,  Tinnie Harada, NP

## 2024-05-31 NOTE — Assessment & Plan Note (Signed)
 Chronic low back pain with left-sided radiation is likely due to musculoskeletal strain, with pregnancy-related causes ruled out. Prescribe naproxen 500mg  twice daily for 7 days, then as needed. Advise use of heat or ice application as needed. Recommend daily stretching exercises, avoiding severe pain. Consider a lidocaine patch for additional pain relief. Instruct her to report if symptoms do not improve in the next couple of weeks.

## 2024-05-31 NOTE — Progress Notes (Signed)
 Established Patient Office Visit  Subjective   Patient ID: Hailey Olson, female    DOB: 1985-11-21  Age: 38 y.o. MRN: [id number]  Chief Complaint  Patient presents with   Back Pain    Lower back pain for 2 weeks, request pregnancy test    HPI  Discussed the use of AI scribe software for clinical note transcription with the patient, who gave verbal consent to proceed.  History of Present Illness   Hailey Olson is a 38 year old female who presents with lower back pain.  She has experienced lower back pain for a week, characterized by a throbbing sensation on the left side, radiating to her buttock and occasionally down her leg. The pain is intermittent and worsens with sitting and bending forward. She denies any recent injury and attributes the pain to inactivity. She uses ibuprofen and avoids activities like bending and lifting to manage the pain.  There are no urinary symptoms or fever. She reports no recent headaches, which she attributes to her medication regimen of propranolol  and amlodipine .  She has an IUD for contraception and would like a pregnancy test since she doesn't get a menstrual period with the IUD.        ROS See pertinent positives and negatives per HPI.    Objective:     BP 122/76 (BP Location: Left Arm, Patient Position: Sitting, Cuff Size: Normal)   Pulse 60   Temp (!) 97.3 F (36.3 C)   Ht 5' 7 (1.702 m)   Wt 231 lb 9.6 oz (105.1 kg)   SpO2 99%   BMI 36.27 kg/m    Physical Exam Vitals and nursing note reviewed.  Constitutional:      General: She is not in acute distress.    Appearance: Normal appearance.  HENT:     Head: Normocephalic.  Eyes:     Conjunctiva/sclera: Conjunctivae normal.  Cardiovascular:     Rate and Rhythm: Normal rate and regular rhythm.     Pulses: Normal pulses.     Heart sounds: Normal heart sounds.  Pulmonary:     Effort: Pulmonary effort is normal.     Breath sounds: Normal breath sounds.  Musculoskeletal:         General: No swelling or tenderness.     Cervical back: Normal range of motion.     Comments: Straight leg raise positive on the left. Lumbar ROM limited due to pain  Skin:    General: Skin is warm.  Neurological:     General: No focal deficit present.     Mental Status: She is alert and oriented to person, place, and time.  Psychiatric:        Mood and Affect: Mood normal.        Behavior: Behavior normal.        Thought Content: Thought content normal.        Judgment: Judgment normal.    The 10-year ASCVD risk score (Arnett DK, et al., 2019) is: 2.2%    Assessment & Plan:   Problem List Items Addressed This Visit       Nervous and Auditory   Acute left-sided low back pain with left-sided sciatica - Primary   Chronic low back pain with left-sided radiation is likely due to musculoskeletal strain, with pregnancy-related causes ruled out. Prescribe naproxen 500mg  twice daily for 7 days, then as needed. Advise use of heat or ice application as needed. Recommend daily stretching exercises, avoiding severe pain. Consider a lidocaine patch  for additional pain relief. Instruct her to report if symptoms do not improve in the next couple of weeks.      Relevant Medications   naproxen (NAPROSYN) 500 MG tablet   Other Relevant Orders   POCT urine pregnancy (Completed)     Other   Anxiety   Her anxiety symptoms have improved with a recent break from work and propranolol  20mg  TID prn.        Return if symptoms worsen or fail to improve.    Tinnie DELENA Harada, NP

## 2024-06-02 NOTE — Telephone Encounter (Signed)
 Pharmacy Patient Advocate Encounter  Received notification from [employer] that Prior Authorization for Lidocaine 5% patches   has been DENIED.  See denial reason below. No denial letter attached in CMM. Will attach denial letter to Media tab once received. PLEASE BE ADVISED Your plan only covers this drug when it is used for certain health conditions. Covered uses are: A) pain associated with post-herpetic neuralgia, B) pain associated with diabetic neuropathy, C) pain associated with cancer-related neuropathy (including treatment-related neuropathy). Your plan does not cover this drug for your health condition that your doctor told us  you have. We reviewed the information we had. Your request has been denied. Your doctor can send us  any new or missing information for us  to review. For this drug, you may have to meet other criteria. You can request the drug policy for more details. You can also request other plan documents for your review  PA #/Case ID/Reference #: [id number]

## 2024-06-08 NOTE — Telephone Encounter (Signed)
 LVM for pt letting her know that lidocaine patches can be purchased over the counter.

## 2024-06-11 ENCOUNTER — Other Ambulatory Visit: Payer: Self-pay | Admitting: Nurse Practitioner

## 2024-07-25 ENCOUNTER — Encounter: Payer: BC Managed Care – PPO | Admitting: Nurse Practitioner
